# Patient Record
Sex: Female | Born: 1965
Health system: Southern US, Community
[De-identification: ages and names within clinical notes are randomized; demographics above are authoritative.]

## PROBLEM LIST (undated history)

## (undated) DIAGNOSIS — M810 Age-related osteoporosis without current pathological fracture: Secondary | ICD-10-CM

## (undated) DIAGNOSIS — N809 Endometriosis, unspecified: Secondary | ICD-10-CM

## (undated) DIAGNOSIS — N80129 Deep endometriosis of ovary, unspecified ovary: Secondary | ICD-10-CM

## (undated) DIAGNOSIS — I4891 Unspecified atrial fibrillation: Secondary | ICD-10-CM

## (undated) HISTORY — DX: Deep endometriosis of ovary, unspecified ovary: N80.129

## (undated) HISTORY — DX: Age-related osteoporosis without current pathological fracture: M81.0

## (undated) HISTORY — DX: Unspecified atrial fibrillation: I48.91

## (undated) HISTORY — DX: Endometriosis, unspecified: N80.9

---

## 2001-01-13 ENCOUNTER — Other Ambulatory Visit: Admission: RE | Admit: 2001-01-13 | Discharge: 2001-01-13 | Payer: Self-pay | Admitting: Family Medicine

## 2004-08-06 ENCOUNTER — Ambulatory Visit: Payer: Self-pay | Admitting: Family Medicine

## 2004-08-19 ENCOUNTER — Ambulatory Visit: Payer: Self-pay | Admitting: Family Medicine

## 2005-02-08 ENCOUNTER — Emergency Department: Payer: Self-pay | Admitting: Emergency Medicine

## 2005-02-09 ENCOUNTER — Ambulatory Visit: Payer: Self-pay | Admitting: Emergency Medicine

## 2005-03-16 HISTORY — PX: PELVIC LAPAROSCOPY: SHX162

## 2005-03-16 HISTORY — PX: OOPHORECTOMY: SHX86

## 2005-05-26 ENCOUNTER — Other Ambulatory Visit: Admission: RE | Admit: 2005-05-26 | Discharge: 2005-05-26 | Payer: Self-pay | Admitting: Obstetrics and Gynecology

## 2005-08-11 ENCOUNTER — Ambulatory Visit (HOSPITAL_BASED_OUTPATIENT_CLINIC_OR_DEPARTMENT_OTHER): Admission: RE | Admit: 2005-08-11 | Discharge: 2005-08-11 | Payer: Self-pay | Admitting: Obstetrics and Gynecology

## 2005-08-11 ENCOUNTER — Encounter (INDEPENDENT_AMBULATORY_CARE_PROVIDER_SITE_OTHER): Payer: Self-pay | Admitting: *Deleted

## 2005-08-25 ENCOUNTER — Ambulatory Visit: Payer: Self-pay | Admitting: Nurse Practitioner

## 2005-08-31 ENCOUNTER — Ambulatory Visit: Payer: Self-pay | Admitting: Nurse Practitioner

## 2006-01-29 ENCOUNTER — Ambulatory Visit: Payer: Self-pay

## 2006-05-07 ENCOUNTER — Ambulatory Visit: Payer: Self-pay | Admitting: Internal Medicine

## 2006-06-17 ENCOUNTER — Other Ambulatory Visit: Admission: RE | Admit: 2006-06-17 | Discharge: 2006-06-17 | Payer: Self-pay | Admitting: Obstetrics and Gynecology

## 2006-10-21 ENCOUNTER — Encounter: Admission: RE | Admit: 2006-10-21 | Discharge: 2006-10-21 | Payer: Self-pay | Admitting: Obstetrics and Gynecology

## 2007-06-29 ENCOUNTER — Other Ambulatory Visit: Admission: RE | Admit: 2007-06-29 | Discharge: 2007-06-29 | Payer: Self-pay | Admitting: Obstetrics and Gynecology

## 2007-10-31 ENCOUNTER — Encounter: Admission: RE | Admit: 2007-10-31 | Discharge: 2007-10-31 | Payer: Self-pay | Admitting: Obstetrics and Gynecology

## 2008-05-02 ENCOUNTER — Ambulatory Visit: Payer: Self-pay | Admitting: Specialist

## 2008-05-08 ENCOUNTER — Encounter: Admission: RE | Admit: 2008-05-08 | Discharge: 2008-05-08 | Payer: Self-pay | Admitting: Internal Medicine

## 2008-07-11 ENCOUNTER — Other Ambulatory Visit: Admission: RE | Admit: 2008-07-11 | Discharge: 2008-07-11 | Payer: Self-pay | Admitting: Obstetrics and Gynecology

## 2008-07-11 ENCOUNTER — Ambulatory Visit: Payer: Self-pay | Admitting: Obstetrics and Gynecology

## 2008-07-11 ENCOUNTER — Encounter: Payer: Self-pay | Admitting: Obstetrics and Gynecology

## 2008-10-31 ENCOUNTER — Encounter: Admission: RE | Admit: 2008-10-31 | Discharge: 2008-10-31 | Payer: Self-pay | Admitting: Obstetrics and Gynecology

## 2008-11-28 ENCOUNTER — Ambulatory Visit: Payer: Self-pay | Admitting: Obstetrics and Gynecology

## 2009-07-25 ENCOUNTER — Other Ambulatory Visit: Admission: RE | Admit: 2009-07-25 | Discharge: 2009-07-25 | Payer: Self-pay | Admitting: Obstetrics and Gynecology

## 2009-07-25 ENCOUNTER — Ambulatory Visit: Payer: Self-pay | Admitting: Obstetrics and Gynecology

## 2009-11-04 ENCOUNTER — Encounter: Admission: RE | Admit: 2009-11-04 | Discharge: 2009-11-04 | Payer: Self-pay | Admitting: Obstetrics and Gynecology

## 2009-11-06 ENCOUNTER — Encounter: Admission: RE | Admit: 2009-11-06 | Discharge: 2009-11-06 | Payer: Self-pay | Admitting: Obstetrics and Gynecology

## 2010-05-06 IMAGING — CT CT CHEST W/ CM
1 series · 15 of 32 positions shown, 19 images · IV contrast (agent unspecified)
Comparison: none

REASON FOR EXAM: abnormal chest xray showed density
COMMENTS:

PROCEDURE:     CT  - CT CHEST WITH CONTRAST  - May 02, 2008  [DATE]
RESULT:
HISTORY: Abnormal chest x-ray.
COMPARISON STUDIES:  No recent.

[Series 2: soft tissue · axial · 0.64mm/px · z∈[-31,+259]mm · 15 of 66 slices shown, 19 images]
[im 5/66  soft-tissue]
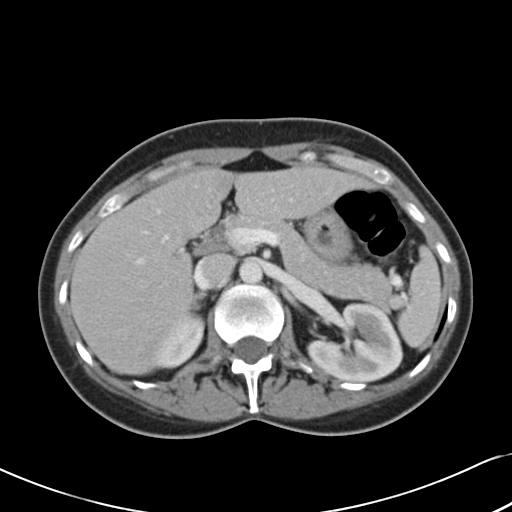
[im 5/66  bone]
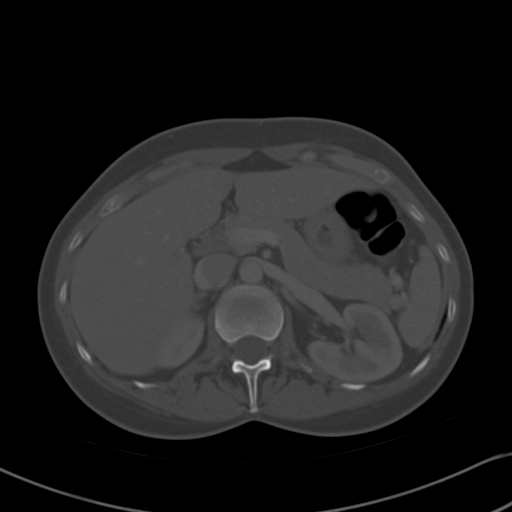
[im 9/66  soft-tissue]
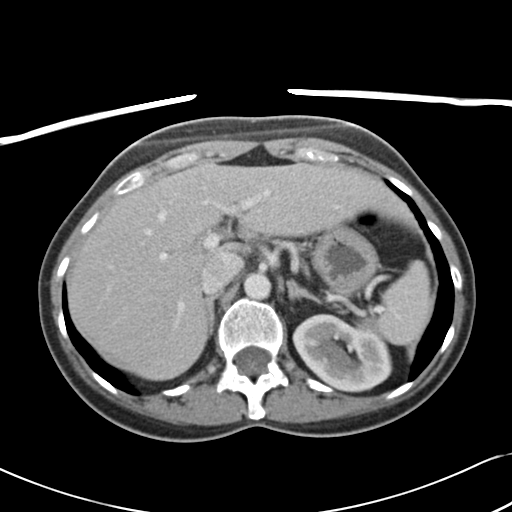
[im 13/66  soft-tissue]
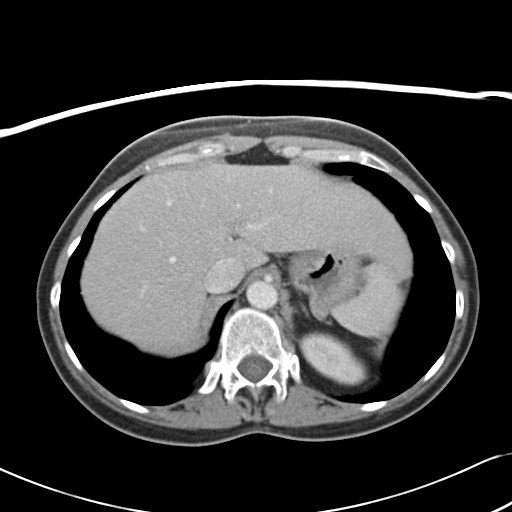
[im 19/66  soft-tissue]
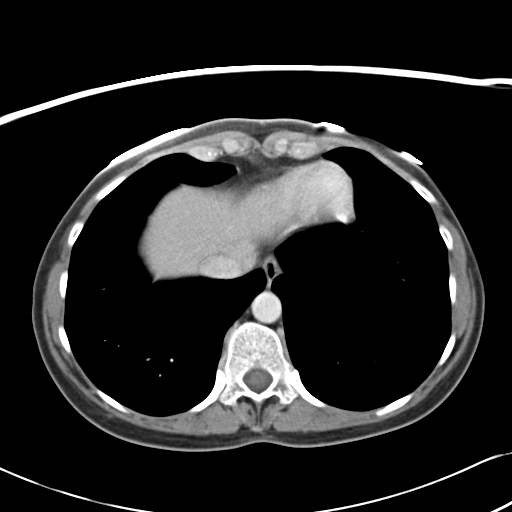
[im 24/66  soft-tissue]
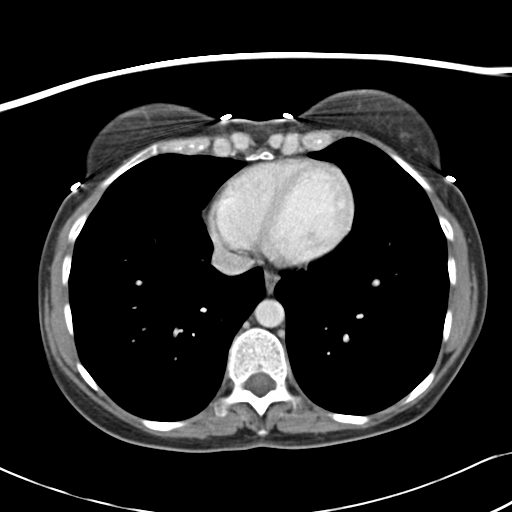
[im 28/66  soft-tissue]
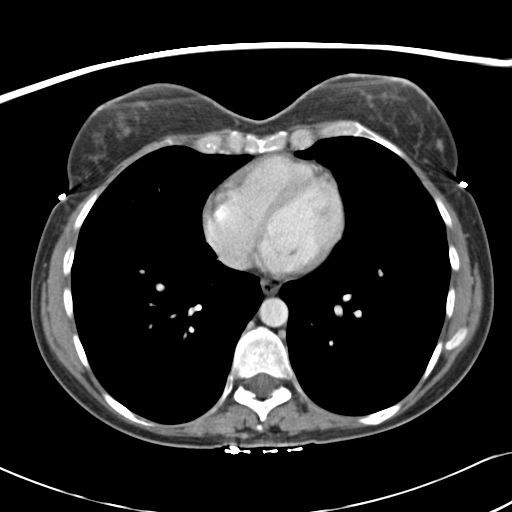
[im 34/66  soft-tissue]
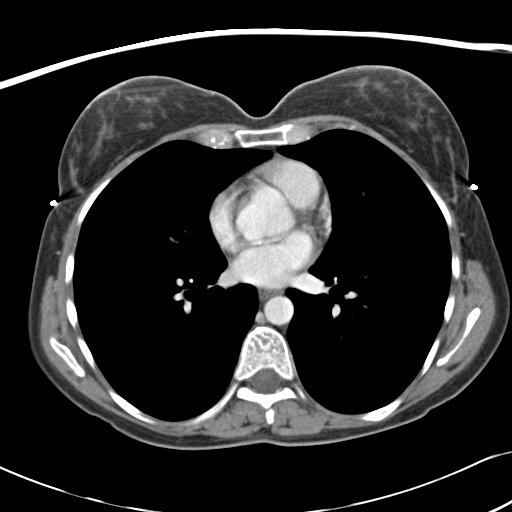
[im 38/66  soft-tissue]
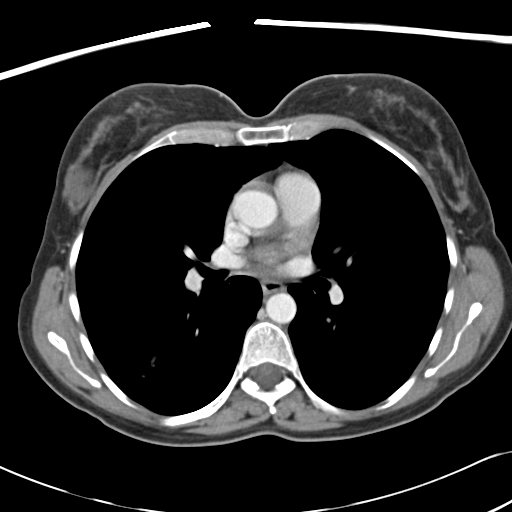
[im 42/66  soft-tissue]
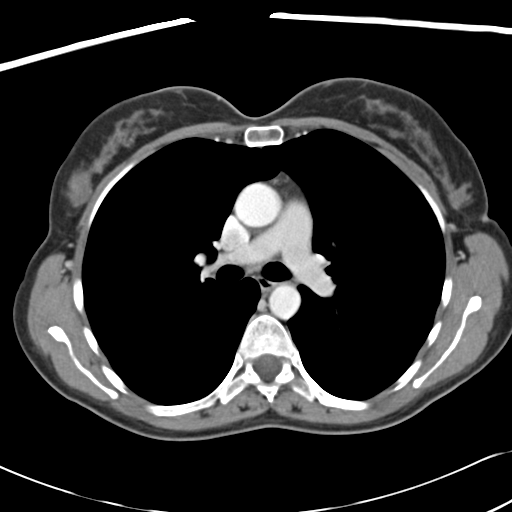
[im 42/66  bone]
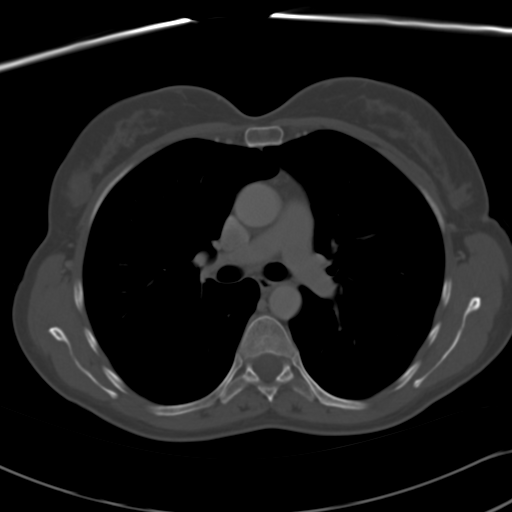
[im 47/66  soft-tissue]
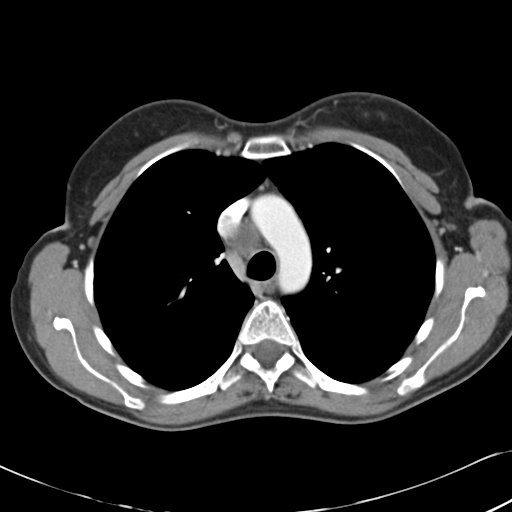
[im 53/66  soft-tissue]
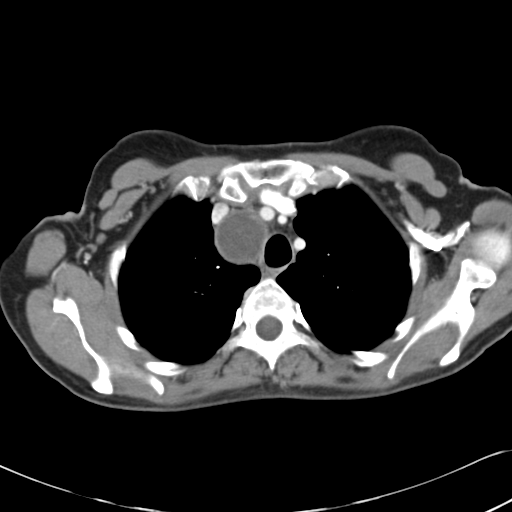
[im 57/66  soft-tissue]
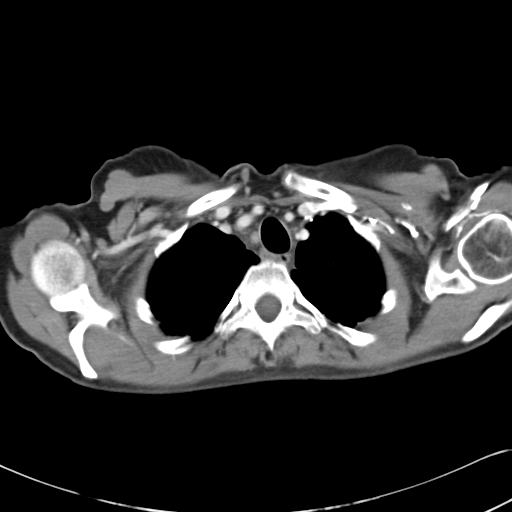
[im 57/66  lung]
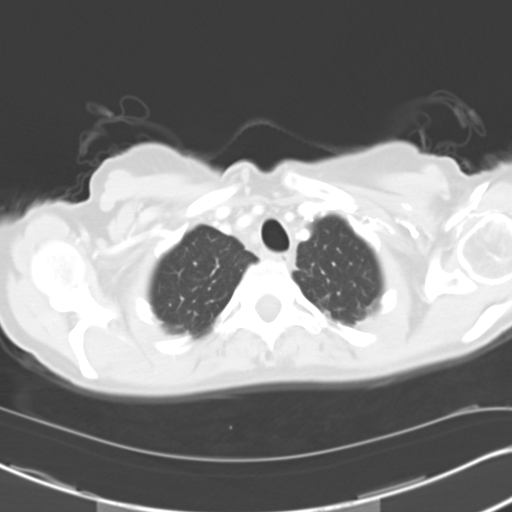
[im 59/66  lung]
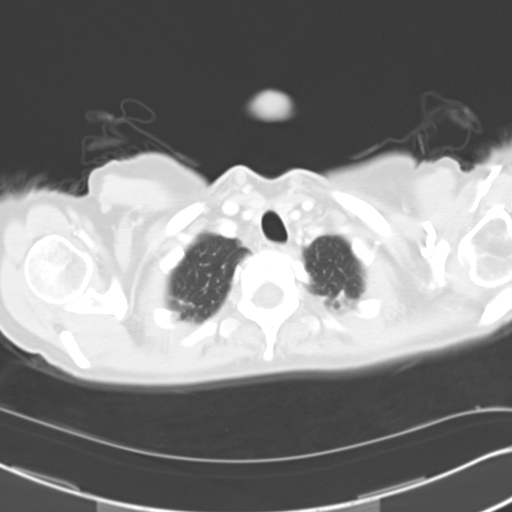
[im 61/66  soft-tissue]
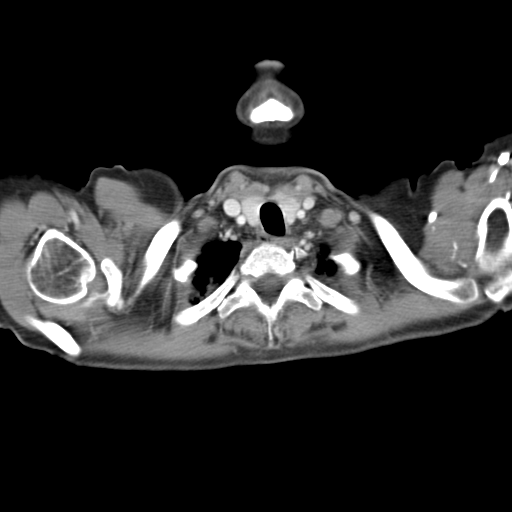
[im 61/66  lung]
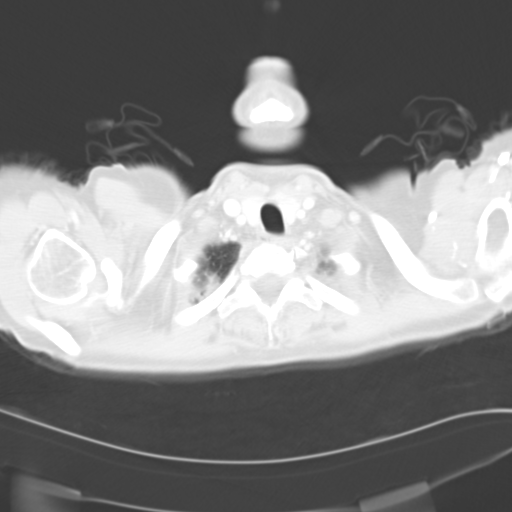
[im 63/66  lung]
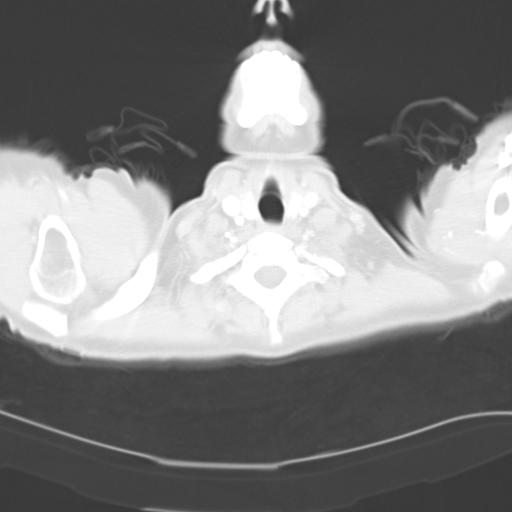

[15 of 32 positions shown; findings below may reference images not displayed]

FINDINGS: A 3.6 cm right middle mediastinal cyst is noted. Hounsfield units
of 10 are noted. This is most likely a bronchial duplication cyst. This can
be followed with subsequent CT's. If need be, we can perform PET/CT.
Cardiothoracic surgery consultation may prove useful. Mild prominence of the
right breast tissue is noted laterally. Mammogram is suggested for further
evaluation. The adrenals are normal. The large airways are patent. Patchy
nodular infiltrates are noted in the right lower lobe. These are most likely
infectious in etiology and although these could represent standard bacterial
pneumonia, granulomatous infection or opportunistic infection cannot be
excluded.
IMPRESSION: 1.  Right paratracheal/mediastinal cyst, most likely a duplication cyst.
2.  Right breast prominent parenchymal density. Mammography is suggested for
further evaluation.
3.  Patchy nodular infiltrates in the right lower lobe as described above.

The findings were discussed with the patient's physician at the time of the
study.

## 2010-08-01 NOTE — Op Note (Signed)
[name], [name]                  ACCOUNT NO.:  [id number]   MEDICAL RECORD NO.:  [id number]          PATIENT TYPE:  AMB   LOCATION:  [facility]                         FACILITY:  [facility]   PHYSICIAN:  Daniel L. Gottsegen, M.D.DATE OF BIRTH:  1965-06-04   DATE OF PROCEDURE:  08/11/2005  DATE OF DISCHARGE:                                 OPERATIVE REPORT   PREOPERATIVE DIAGNOSIS:  Right adnexal mass.   POSTOPERATIVE DIAGNOSES:  Endometrioma of right ovary, endometriosis.   NAME OF OPERATION:  Diagnostic laparoscopy with right salpingo-oophorectomy,  laser vaporization of endometriosis.   SURGEON:  Dr. Eda Paschal.   FIRST ASSISTANT:  Dr. Lily Peer.   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  An  ovarian neoplasm was diagnosed.  It has been watched and it has failed to  resolve, and so she now enters the hospital for definitive surgery.  It is  approximately 6 cm.  It has an appearance most consistent with  endometriosis.   FINDINGS:  At the time of surgery, the patient had a large endometrioma of  the right ovary of about 6 cm.  It was removed intact, but after it was  removed it was opened outside the body and was clearly an endometrioma.  The  patient also had endometriosis involving the cul-de-sac, the vesicouterine  fold of peritoneum, the left ovary and underneath the left ovary.  Some of  the endometriosis was pigmented, some of it was red, some of it was  nonpigmented.  Total area involved was approximately 4 cm.  It was more  superficial than deep other than the endometrioma on the right.  The  ileocecal junction was identified, and the patient had a normal appendix.   PROCEDURE:  After adequate general endotracheal anesthesia, the patient was  placed in the dorsal lithotomy position, prepped and draped in the usual  sterile manner.  A Hulka tenaculum had been inserted in  the patient's  uterus.  A Robinson catheter was utilized to empty the bladder.  A  pneumoperitoneum was created with the Veress needle, and then using the  Optiview, a 10-mm laparoscope was introduced subumbilically through the  subumbilical incision that had been made with the Veress needle.  Two other  ports were placed in the pelvis, one in the midline and one in the right  lower quadrant, and 5-mm instruments were placed through it including  irrigator-aspirators, graspers, bipolar, tripolar.  First, peritoneal  washings were obtained.  Next, attention was turned to the right ovary and  tube.  It was elevated.  The ureter was identified.  The IP ligament was  bipolared and cut.  The rest of the attachments of the ovary and tube to the  broad ligaments of the uterus were bipolared and cut.  No bleeding was  encountered.  Following this, the neodynium yag________ laser was utilized  through the operating scope of the subumbilical channel.  It was used at 12  watts power.  A GR-4 tip was  used over the bare fiber.  All areas of  endometriosis could be laser vaporized without injuring any vital  structures.  Copious irrigation was done with sterile saline.  At the  termination of procedure, there was no bleeding noted.  At this point, the  laparoscope was removed.  A 5-mm laparoscope was placed in the right lower  quadrant.  An Endopouch was placed subumbilical and the specimen was removed  intact.  The procedure was then terminated.  The pneumoperitoneum evacuated.  The  subumbilical fascial incision was closed with figure-of-eights of 0 Vicryl.  The subumbilical skin incision was closed with 3-0 Monocryl, and Steri-  Strips were used on the pelvic 5-mm incisions.  The patient tolerated the  procedure well and left the operating room in satisfactory condition.      Daniel L. Eda Paschal, M.D.  Electronically Signed     DLG/MEDQ  D:  08/11/2005  T:  08/11/2005  Job:  [id number]

## 2010-08-12 ENCOUNTER — Encounter: Payer: Self-pay | Admitting: Obstetrics and Gynecology

## 2010-08-28 ENCOUNTER — Other Ambulatory Visit (HOSPITAL_COMMUNITY)
Admission: RE | Admit: 2010-08-28 | Discharge: 2010-08-28 | Disposition: A | Discharge status: Home / Self Care | Payer: BC Managed Care – PPO | Source: Ambulatory Visit | Attending: Obstetrics and Gynecology | Admitting: Obstetrics and Gynecology

## 2010-08-28 ENCOUNTER — Other Ambulatory Visit: Payer: Self-pay | Admitting: Obstetrics and Gynecology

## 2010-08-28 ENCOUNTER — Encounter (INDEPENDENT_AMBULATORY_CARE_PROVIDER_SITE_OTHER): Payer: BC Managed Care – PPO | Admitting: Obstetrics and Gynecology

## 2010-08-28 DIAGNOSIS — R809 Proteinuria, unspecified: Secondary | ICD-10-CM

## 2010-08-28 DIAGNOSIS — Z124 Encounter for screening for malignant neoplasm of cervix: Secondary | ICD-10-CM | POA: Insufficient documentation

## 2010-08-28 DIAGNOSIS — Z01419 Encounter for gynecological examination (general) (routine) without abnormal findings: Secondary | ICD-10-CM

## 2010-10-31 ENCOUNTER — Other Ambulatory Visit: Payer: Self-pay | Admitting: Obstetrics and Gynecology

## 2010-10-31 DIAGNOSIS — Z1231 Encounter for screening mammogram for malignant neoplasm of breast: Secondary | ICD-10-CM

## 2010-11-07 ENCOUNTER — Telehealth: Payer: Self-pay | Admitting: *Deleted

## 2010-11-07 DIAGNOSIS — N912 Amenorrhea, unspecified: Secondary | ICD-10-CM

## 2010-11-07 NOTE — Telephone Encounter (Signed)
Patient informed needs FSH. Will get done at her [facility]. Faxed order.

## 2010-11-07 NOTE — Telephone Encounter (Signed)
Patient called c/o last menses was 09/13/10.  She has had no sign of it coming on.  No cramps, spotting, change of diet, or stress going on.  Wants to know if she needs to have her labs checked.

## 2010-11-07 NOTE — Telephone Encounter (Signed)
Have patient come in for an [facility]. Have pt call me for the results. She works at the [employer]. If she would like to have the [facility] drawn they are then call In order please.

## 2010-11-10 ENCOUNTER — Ambulatory Visit
Admission: RE | Admit: 2010-11-10 | Discharge: 2010-11-10 | Disposition: A | Discharge status: Home / Self Care | Payer: BC Managed Care – PPO | Source: Ambulatory Visit | Attending: Obstetrics and Gynecology | Admitting: Obstetrics and Gynecology

## 2010-11-10 ENCOUNTER — Encounter: Payer: Self-pay | Admitting: Obstetrics and Gynecology

## 2010-11-10 DIAGNOSIS — Z1231 Encounter for screening mammogram for malignant neoplasm of breast: Secondary | ICD-10-CM

## 2010-11-11 ENCOUNTER — Telehealth: Payer: Self-pay

## 2010-11-11 MED ORDER — MEDROXYPROGESTERONE ACETATE 10 MG PO TABS: 10.0000 mg | ORAL_TABLET | Freq: Every day | ORAL | Status: DC

## 2010-11-11 NOTE — Telephone Encounter (Signed)
PT. CALLED BACK AND WANTS TO KNOW IF SHE CAN WAIT TO START PROVERA RX UNTIL AFTER SHE RETURNS FROM VACATION. SHE THINKS SHE WILL BE LEAVING 11/13/10 OR 11/18/10.

## 2010-11-11 NOTE — Telephone Encounter (Signed)
Message copied by Venora Maples on Tue Nov 11, 2010 12:52 PM ------      Message from: Trellis Paganini      Created: Mon Nov 10, 2010 12:50 PM       Tell patient her [facility] is in the premenopausal range. We should give her Provera 10 mg a day for 5 days. I think she may already  have a prescription but please check with her.

## 2010-11-11 NOTE — Telephone Encounter (Signed)
Yes

## 2010-11-11 NOTE — Telephone Encounter (Signed)
PT. NOTIFIED PER DR. G'S NOTE.

## 2010-11-11 NOTE — Telephone Encounter (Signed)
PT. NOTIFIED OF DR. G'S NOTE BELOW. SHE DOES NOT HAVE RX SO I SENT IT TO HER [facility].

## 2010-12-10 ENCOUNTER — Telehealth: Payer: Self-pay | Admitting: *Deleted

## 2010-12-10 NOTE — Telephone Encounter (Signed)
Patient just wanted to let you know that she started her period on her own on 11/28/10.  Bled ill 12/01/10 then started spotting 12/01/10-12/05/10.  Patient also has been on an antibiotic at that time.  She also wanted to let you know that she had a few hot flashes at night during the period time.  This was just an [place].

## 2010-12-10 NOTE — Telephone Encounter (Signed)
Thank you :)

## 2011-02-04 NOTE — Progress Notes (Signed)
Patient ID: [name], female   DOB: 07-03-1965, 45 y.o.   MRN: [id number] PER DR. Reece Agar AFTER REVIEWING THE FAXED NOTE AND RECORDS PT. SENT TO HIM, HE RECOMMENDS RECHECK U.S. AND 30 MINUTE OV IN 8 WEEKS. PT. NOTIFIED OF ALL THE ABOVE INFO. AND IS FINE TO WAIT UNTIL THEN. I TRANSFERRED HER UP TO APPOINTMENTS.

## 2011-03-18 DIAGNOSIS — N80129 Deep endometriosis of ovary, unspecified ovary: Secondary | ICD-10-CM | POA: Insufficient documentation

## 2011-03-18 DIAGNOSIS — N809 Endometriosis, unspecified: Secondary | ICD-10-CM | POA: Insufficient documentation

## 2011-03-23 ENCOUNTER — Other Ambulatory Visit: Payer: Self-pay | Admitting: Obstetrics and Gynecology

## 2011-03-24 ENCOUNTER — Ambulatory Visit: Payer: BC Managed Care – PPO

## 2011-03-24 ENCOUNTER — Ambulatory Visit (INDEPENDENT_AMBULATORY_CARE_PROVIDER_SITE_OTHER): Payer: BC Managed Care – PPO

## 2011-03-24 ENCOUNTER — Ambulatory Visit: Payer: BC Managed Care – PPO | Admitting: Obstetrics and Gynecology

## 2011-03-24 ENCOUNTER — Ambulatory Visit (INDEPENDENT_AMBULATORY_CARE_PROVIDER_SITE_OTHER): Payer: BC Managed Care – PPO | Admitting: Obstetrics and Gynecology

## 2011-03-24 DIAGNOSIS — N949 Unspecified condition associated with female genital organs and menstrual cycle: Secondary | ICD-10-CM

## 2011-03-24 DIAGNOSIS — N809 Endometriosis, unspecified: Secondary | ICD-10-CM

## 2011-03-24 DIAGNOSIS — N83209 Unspecified ovarian cyst, unspecified side: Secondary | ICD-10-CM

## 2011-03-24 DIAGNOSIS — R102 Pelvic and perineal pain: Secondary | ICD-10-CM

## 2011-03-24 NOTE — Progress Notes (Signed)
Patient went to see her PCP a in November of 2012. She was complaining of right lower quadrant pain which she now thinks was muscular. We have previously done a right salpingo-oophorectomy for endometriosis. He sent her for a pelvic ultrasound which showed 2 cysts on her left ovary. One cyst was a simple follicle of 15 mm. The second cyst was complex of 19 mm. It had a septum and some internal echoes. There was free fluid seen lateral to the ovary. The patient is now asymptomatic but she came back for followup ultrasound.  Ultrasound: Patient has an anteverted uterus with a homogeneous echo pattern. She does have a arcuate uterus with an endometrial echo 7.1 mm. Her right adnexa is free of the mass. On the left adnexa there is a thick walled 1.1 cm echo-free cyst without vascular flow. There is a second cyst which is thick walled 2.2 cm with internal low level echoes and vascular flow to the periphery. There is a third cyst which is 1.9 cm and is thin walled with reticular echo pattern. This third cyst is vascular free.  Assessment: Asymptomatic small cysts of left ovary.  Plan: I told patient I think these are nonsuspicious. Certainly one of them could be endometriosis. Since she is asymptomatic I believe we should do nothing surgically at the moment. She will let me know if she starts to get pain. Plan followup ultrasound on the same day as her yearly exam in 4 months.

## 2011-07-27 ENCOUNTER — Telehealth: Payer: Self-pay | Admitting: *Deleted

## 2011-07-27 NOTE — Telephone Encounter (Signed)
Pt saw you for ultrasound in jan 2013 and was told to call back when cycle started, which was today. She was told to check with insurance to see if ultrasound and annual can be done same day and it can. She would like to know if you wanted ultrasound & annual in June (this is when her annual is due) 09/02/11 OV. Or have ultrasound done in may, per pt was told to call back in may and let you know when cycle started. Please advise

## 2011-07-27 NOTE — Telephone Encounter (Signed)
Pt informed with the below note. 

## 2011-07-27 NOTE — Telephone Encounter (Signed)
We can do both ultrasound and exam the same day in June. Ideally it should be scheduled the week after  her cycle ends.

## 2011-08-26 ENCOUNTER — Other Ambulatory Visit: Payer: Self-pay | Admitting: Obstetrics and Gynecology

## 2011-08-26 DIAGNOSIS — N83209 Unspecified ovarian cyst, unspecified side: Secondary | ICD-10-CM

## 2011-09-02 ENCOUNTER — Encounter: Payer: Self-pay | Admitting: Obstetrics and Gynecology

## 2011-09-02 ENCOUNTER — Other Ambulatory Visit (HOSPITAL_COMMUNITY)
Admission: RE | Admit: 2011-09-02 | Discharge: 2011-09-02 | Disposition: A | Discharge status: Home / Self Care | Payer: BC Managed Care – PPO | Source: Ambulatory Visit | Attending: Obstetrics and Gynecology | Admitting: Obstetrics and Gynecology

## 2011-09-02 ENCOUNTER — Encounter: Payer: BC Managed Care – PPO | Admitting: Obstetrics and Gynecology

## 2011-09-02 ENCOUNTER — Ambulatory Visit (INDEPENDENT_AMBULATORY_CARE_PROVIDER_SITE_OTHER): Payer: BC Managed Care – PPO

## 2011-09-02 ENCOUNTER — Ambulatory Visit (INDEPENDENT_AMBULATORY_CARE_PROVIDER_SITE_OTHER): Payer: BC Managed Care – PPO | Admitting: Obstetrics and Gynecology

## 2011-09-02 VITALS — BP 110/70 | Ht 65.5 in | Wt 133.0 lb

## 2011-09-02 DIAGNOSIS — Z01419 Encounter for gynecological examination (general) (routine) without abnormal findings: Secondary | ICD-10-CM | POA: Insufficient documentation

## 2011-09-02 DIAGNOSIS — N8300 Follicular cyst of ovary, unspecified side: Secondary | ICD-10-CM

## 2011-09-02 DIAGNOSIS — N949 Unspecified condition associated with female genital organs and menstrual cycle: Secondary | ICD-10-CM

## 2011-09-02 DIAGNOSIS — R102 Pelvic and perineal pain: Secondary | ICD-10-CM

## 2011-09-02 DIAGNOSIS — N83209 Unspecified ovarian cyst, unspecified side: Secondary | ICD-10-CM

## 2011-09-02 NOTE — Progress Notes (Signed)
Patient came to see me today for her annual GYN exam. She is having regular cycles although slightly irregular. They are tolerable. She contracepts  with condoms. She is due for her mammogram in August. She needs followup for her small ovarian cysts seen previously and will have an ultrasound after today's visit. She does her lab work from her PCP. Her pelvic pain is basically gone. She is having no abnormal bleeding.  Physical examination:[name] present. HEENT within normal limits. Neck: Thyroid not large. No masses. Supraclavicular nodes: not enlarged. Breasts: Examined in both sitting and lying  position. No skin changes and no masses. Abdomen: Soft no guarding rebound or masses or hernia. Pelvic: External: Within normal limits. BUS: Within normal limits. Vaginal:within normal limits. Good estrogen effect. No evidence of cystocele rectocele or enterocele. Cervix: clean. Uterus: Normal size and shape. Adnexa: No masses. Rectovaginal exam: Confirmatory and negative. Extremities: Within normal limits.  Assessment: History of ovarian cysts. History of endometriosis.  Plan: Mammogram. Pelvic ultrasound. Discussed a Mirena IUD for birth control. She will inform if interested.  Addendum: Patient had her ultrasound. She has an arcuate uterus with 2 endometrial cavities. They are 8.2 and 7.6 mm. Right adnexa is negative for any masses. She is status post right S and O. Her left ovary appears normal. The 3 previous small ovarian cysts have resolved. She was reassured. She will take Provera when necessary for amenorrhea. Lab work ordered through the [facility].

## 2011-11-05 ENCOUNTER — Other Ambulatory Visit: Payer: Self-pay | Admitting: Obstetrics and Gynecology

## 2011-11-05 DIAGNOSIS — Z1231 Encounter for screening mammogram for malignant neoplasm of breast: Secondary | ICD-10-CM

## 2011-11-12 ENCOUNTER — Telehealth: Payer: Self-pay | Admitting: Obstetrics and Gynecology

## 2011-11-12 NOTE — Telephone Encounter (Signed)
Ideally she should do it on day 3 or 4 of her cycle.

## 2011-11-12 NOTE — Telephone Encounter (Signed)
At Dr Timoteo Expose request I contacted patient to follow-up because she never had FSH drawn. Dr. Reece Agar wanted me to find out if cycles have been regular.  Patient said she had not had a period since May until end of July when she did have a normal period.  She said she at June visit Dr. Reece Agar had given her the slip for [facility] to be drawn.  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   She will have it drawn but wanted me to ask Dr. Reece Agar as she thinks he told her she would get the truest reading if she had it drawn after her period??  Also, she wants Dr. Reece Agar to know she has her mammo scheduled for 11/30/11 at [facility].

## 2011-11-12 NOTE — Telephone Encounter (Signed)
Patient advised.

## 2011-11-30 ENCOUNTER — Ambulatory Visit
Admission: RE | Admit: 2011-11-30 | Discharge: 2011-11-30 | Disposition: A | Discharge status: Home / Self Care | Payer: BC Managed Care – PPO | Source: Ambulatory Visit | Attending: Obstetrics and Gynecology | Admitting: Obstetrics and Gynecology

## 2011-11-30 DIAGNOSIS — Z1231 Encounter for screening mammogram for malignant neoplasm of breast: Secondary | ICD-10-CM

## 2011-12-24 ENCOUNTER — Telehealth: Payer: Self-pay | Admitting: Obstetrics and Gynecology

## 2011-12-24 NOTE — Telephone Encounter (Signed)
Patient sent Dr. Reece Agar a letter.  Please see letter scanned in system dates 12-23-11.  I called patient back with Dr. Timoteo Expose response in regard to the letter. Dr. Reece Agar did not feel it would be beneficial to check another [facility] on 10/11 (day 3) as it was so low at 6.0 on 12/17/11.  Patient was advised if she still wanted to check it that tomorrow Day 3 would be the correct date.

## 2012-01-06 ENCOUNTER — Ambulatory Visit (INDEPENDENT_AMBULATORY_CARE_PROVIDER_SITE_OTHER): Payer: BC Managed Care – PPO | Admitting: Obstetrics and Gynecology

## 2012-01-06 ENCOUNTER — Encounter: Payer: Self-pay | Admitting: Obstetrics and Gynecology

## 2012-01-06 DIAGNOSIS — N912 Amenorrhea, unspecified: Secondary | ICD-10-CM

## 2012-01-06 MED ORDER — MEDROXYPROGESTERONE ACETATE 10 MG PO TABS: 10.0000 mg | ORAL_TABLET | Freq: Every day | ORAL | Status: DC

## 2012-01-06 NOTE — Patient Instructions (Signed)
Every 60 days that you do  not have a period Take Provera for 5 days. Call us if you do not start within 2 weeks of finishing the Provera. If  You do not use  birth control Do a pregnancy test before you start Provera.

## 2012-01-06 NOTE — Progress Notes (Signed)
Patient came back to see me today to discuss her cycles. For awhile she had amenorrhea and she had an elevated FSH. Then she started to have regular cycles again and her [facility] returned to  Normal(premenopausal). She continues to use a condom for birth control. She is now had a cycle in August and October. She is not having menopausal symptoms.  We discussed this in great detail. She will continue to use condoms. If she goes over 60 days without bleeding she will take Provera for 5 days to prevent hyperplasia. She will call if she does not withdrawal. She will do a pregnancy test first if she does  does not use birth control.

## 2012-09-06 ENCOUNTER — Encounter: Payer: Self-pay | Admitting: Gynecology

## 2012-09-06 ENCOUNTER — Ambulatory Visit (INDEPENDENT_AMBULATORY_CARE_PROVIDER_SITE_OTHER): Payer: BC Managed Care – PPO | Admitting: Gynecology

## 2012-09-06 VITALS — BP 110/70 | Ht 66.0 in | Wt 134.0 lb

## 2012-09-06 DIAGNOSIS — Z01419 Encounter for gynecological examination (general) (routine) without abnormal findings: Secondary | ICD-10-CM

## 2012-09-06 DIAGNOSIS — N926 Irregular menstruation, unspecified: Secondary | ICD-10-CM

## 2012-09-06 NOTE — Progress Notes (Signed)
[name] Dec 30, 1965 [id number]        47 y.o.  G0P0 for annual exam.  Former patient of Dr. Eda Goodman. Several issues noted below.  Past medical history,surgical history, medications, allergies, family history and social history were all reviewed and documented in the EPIC chart.  ROS:  Performed and pertinent positives and negatives are included in the history, assessment and plan .  Exam: [name] assistant Filed Vitals:   09/06/12 1456  BP: 110/70  Height: 5\' 6"  (1.676 m)  Weight: 134 lb (60.782 kg)   General appearance  Normal Skin grossly normal Head/Neck normal with no cervical or supraclavicular adenopathy thyroid normal Lungs  clear Cardiac RR, without RMG Abdominal  soft, nontender, without masses, organomegaly or hernia Breasts  examined lying and sitting without masses, retractions, discharge or axillary adenopathy. Pelvic  Ext/BUS/vagina  normal   Cervix  normal   Uterus  anteverted, normal size, shape and contour, midline and mobile nontender   Adnexa  Without masses or tenderness    Anus and perineum  normal   Rectovaginal  normal sphincter tone without palpated masses or tenderness.    Assessment/Plan:  47 y.o. G0P0 female for annual exam.   1. Irregular menses. Patient apparently had gone through premature menopause with elevated FSH in the past but then resumed menstrual function. She now has some mild irregularity where she may skip one month. Dr. Eda Goodman had given her Provera to withdraw if she does more than 2 months without a menses. She has not used it this past year. No intermenstrual bleeding. We'll recheck her TSH and FSH now. She has the blood drawn at her office I gave her prescription and it will be called back to [place]. Options for management to include low-dose oral contraceptives for both menstrual regulation, contraception and occasional hot flash control. Alternatives such as Mirena IUD also discussed. Patient is not interested in doing anything at this  point but monitoring and keeping a menstrual calendar. She will call me if she goes more than 2 months without menses for the Provera. 2. Contraception. Patient using condoms. As per above we discussed alternatives but she is satisfied with condoms and understands accepts and failure risks. 3. Bone density. Patient had bone density done at her office in 2011 when she was diagnosed with premature menopause. T score -2.2 Z score -1.7. I discussed the issues of low bone mass in the difference between T-scores and Z scores. The issue as to whether she was truly postmenopausal and which value to use. As she is now regularly menstruating will plan on monitoring and repeating her bone density at age 81 as a new baseline. Increase calcium vitamin D reviewed. 4. History of endometriosis with endometrioma status post RSO in the past. Doing well without significant pain or other symptoms. 5. Pap smear 2013. No Pap smear done today. No history of significant abnormal Pap smears previously. Repeat Pap smear at 3 year interval. 6. Mammography 11/2011. Continued annual mammography. 7. Health maintenance. CBC comprehensive metabolic panel lipid profile urinalysis prescription given to have drawn at her work and to mail back. Followup one year, sooner as needed.    Kimberly Lords MD, 4:07 PM 09/06/2012

## 2012-09-06 NOTE — Patient Instructions (Signed)
Call if you go more than 2 months without a period. Forward your blood work drawn at work to our office.

## 2012-09-13 ENCOUNTER — Encounter: Payer: Self-pay | Admitting: Gynecology

## 2012-09-13 ENCOUNTER — Telehealth: Payer: Self-pay | Admitting: Gynecology

## 2012-09-13 NOTE — Telephone Encounter (Signed)
Tell patient FSH is elevated consistent with menopause. I would recommend keeping menstrual calendar and as long as less frequent but "regular" menses and watch. If prolonged or atypical bleeding call.

## 2012-09-13 NOTE — Telephone Encounter (Signed)
PT INFORMED WITH THE BELOW NOTE. 

## 2012-10-04 ENCOUNTER — Telehealth: Payer: Self-pay | Admitting: *Deleted

## 2012-10-04 MED ORDER — MEDROXYPROGESTERONE ACETATE 10 MG PO TABS: 10.0000 mg | ORAL_TABLET | Freq: Every day | ORAL | Status: DC

## 2012-10-04 NOTE — Telephone Encounter (Signed)
Pt called stating she has not had a cycle since April this year, some very light spotting but never a full flow. Pt said if this should occur she was told to call back and Rx for provera will be sent to pharmacy. Please advise

## 2012-10-04 NOTE — Telephone Encounter (Signed)
Make sure patient checks UPT OTC first, then Provera 10 mg daily x12 days

## 2012-10-04 NOTE — Telephone Encounter (Signed)
Left a detailed message on her voicemail per request with the below note, Rx called in to pharmacy. I told pt if test should be positive not to take medication and call office.

## 2012-10-31 ENCOUNTER — Telehealth: Payer: Self-pay | Admitting: *Deleted

## 2012-10-31 ENCOUNTER — Encounter: Payer: Self-pay | Admitting: *Deleted

## 2012-10-31 NOTE — Telephone Encounter (Signed)
Pt informed with the below note, she will watch for now and call back if needed. Pt asked if a not could be given stating she was in office for annual in June.

## 2012-10-31 NOTE — Telephone Encounter (Signed)
Pt said she had this done already, I looked in computer and the most recent lab on 09/09/12 with TSH/FSH. Do you need another level to be drawn?

## 2012-10-31 NOTE — Telephone Encounter (Signed)
Pt calling to follow up from telephone encounter 10/04/12, she took the last pill of provera 10 mg x 12 days on 10/22/12. And no cycle yet, pt asked should she wait a little longer? Please advise

## 2012-10-31 NOTE — Telephone Encounter (Signed)
I had ordered a TSH/FSH that she was to have drawn at her office and send [place] the results. I do not see those in our computer. If we can get those results first before I decide what to do.

## 2012-10-31 NOTE — Telephone Encounter (Signed)
I did not see that before. Her FSH is elevated consistent with menopausal changes and it is not unusual not to withdraw to Provera. At this point I would just watch. As long as she has less frequent but regular menses when they occur then I would follow. She did have prolonged or atypical bleeding than to have her call. It does not appear that she needs to take the Provera on any regular basis because she probably is not growing enough lining of the uterus to shed to bring on a period.

## 2012-11-04 ENCOUNTER — Other Ambulatory Visit: Payer: Self-pay

## 2012-11-04 DIAGNOSIS — Z1231 Encounter for screening mammogram for malignant neoplasm of breast: Secondary | ICD-10-CM

## 2012-11-30 ENCOUNTER — Ambulatory Visit
Admission: RE | Admit: 2012-11-30 | Discharge: 2012-11-30 | Disposition: A | Discharge status: Home / Self Care | Payer: BC Managed Care – PPO | Source: Ambulatory Visit

## 2012-11-30 DIAGNOSIS — Z1231 Encounter for screening mammogram for malignant neoplasm of breast: Secondary | ICD-10-CM

## 2013-03-07 ENCOUNTER — Telehealth: Payer: Self-pay

## 2013-03-07 ENCOUNTER — Other Ambulatory Visit: Payer: Self-pay | Admitting: Gynecology

## 2013-03-07 MED ORDER — MEDROXYPROGESTERONE ACETATE 10 MG PO TABS: 10.0000 mg | ORAL_TABLET | Freq: Every day | ORAL | Status: DC

## 2013-03-07 NOTE — Telephone Encounter (Signed)
I would recommend a repeat course of Provera 10 mg x10 days.

## 2013-03-07 NOTE — Telephone Encounter (Signed)
Patient informed Rx in. 

## 2013-03-07 NOTE — Telephone Encounter (Signed)
Per patient FSH level in June 2014 was 122.4. She said in Oct 2014 she had it retested out of curiosity because her hotflashes had stopped and her FSH level was 15.0.  She said she still has had no menses since July.  Had a little brown discharge in Oct and now with a little brown discharge. She wonders is period trying to start.  She just wanted to be sure that she is going along okay and not anything different she needs to be doing. She said back in July you had given her a round of Provera.

## 2013-03-13 ENCOUNTER — Other Ambulatory Visit: Payer: Self-pay

## 2013-03-21 ENCOUNTER — Telehealth: Payer: Self-pay | Admitting: *Deleted

## 2013-03-21 NOTE — Telephone Encounter (Signed)
As long as patient as long as pregnancy not a possibility I would take the Provera. If possible I would check a UPT first and then take the Provera.

## 2013-03-21 NOTE — Telephone Encounter (Signed)
Pt was given Rx for provera 10 mg x 10 days on 03/07/13 she never started Rx yet. Patient said this am she had some bright red blood then throughout the day brown blood, feels as if cycle may start but no flow. No cycle since July 2014, pt asked if you thought she should still take provera or wait to see if cycle comes? Please advise

## 2013-03-22 NOTE — Telephone Encounter (Signed)
Pt informed with the below note. 

## 2013-03-22 NOTE — Telephone Encounter (Signed)
Pt said no chance of pregnancy she will start provera.

## 2013-06-23 ENCOUNTER — Telehealth: Payer: Self-pay | Admitting: *Deleted

## 2013-06-23 NOTE — Telephone Encounter (Signed)
(  pt aware you are out of the office) Pt called to update her cycles patient took provera 10 mg x 10days on 03/22/13 had cycle on 04/01/13 that cycle stopped on 04/10/13. Had another cycle on 04/17/13 which lasted until 04/22/13, no further bleeding c/o very minimum hot flashes x 2 days. She asked if provera should kept on hand if case no cycle? Please advise

## 2013-06-26 MED ORDER — MEDROXYPROGESTERONE ACETATE 10 MG PO TABS: 10.0000 mg | ORAL_TABLET | Freq: Every day | ORAL | Status: DC

## 2013-06-26 NOTE — Telephone Encounter (Signed)
Okay for Provera 10 mg x10 days if she does not have a menses by 8 weeks. Refill x4

## 2013-06-26 NOTE — Telephone Encounter (Signed)
Left on voicemail this was sent and the below left as well.

## 2013-06-28 ENCOUNTER — Other Ambulatory Visit: Payer: Self-pay

## 2013-06-28 MED ORDER — MEDROXYPROGESTERONE ACETATE 10 MG PO TABS: 10.0000 mg | ORAL_TABLET | Freq: Every day | ORAL | Status: DC

## 2013-06-28 NOTE — Telephone Encounter (Signed)
At 09/06/12 RGCE you wrote "Irregular menses. Patient apparently had gone through premature menopause with elevated FSH in the past but then resumed menstrual function. She now has some mild irregularity where she may skip one month. Dr. Eda PaschalGottsegen had given her Provera to withdraw if she does more than 2 months without a menses. She has not used it this past year. No intermenstrual bleeding. We'll recheck her TSH and FSH now. She has the blood drawn at her office I gave her prescription and it will be called back to us. Options for management to include low-dose oral contraceptives for both menstrual regulation, contraception and occasional hot flash control. Alternatives such as Mirena IUD also discussed. Patient is not interested in doing anything at this point but monitoring and keeping a menstrual calendar. She will call me if she goes more than 2 months without menses for the Provera."

## 2013-06-29 ENCOUNTER — Other Ambulatory Visit: Payer: Self-pay

## 2013-06-29 ENCOUNTER — Other Ambulatory Visit: Payer: Self-pay | Admitting: Gynecology

## 2013-08-08 ENCOUNTER — Telehealth: Payer: Self-pay | Admitting: *Deleted

## 2013-08-08 NOTE — Telephone Encounter (Signed)
Pt was prescribed Provera 10 mg x10 days if she does not have a menses by 8 weeks on telephone encounter 06/23/13. Pt took last pill on 07/29/13 and no cycle yet. I explained to patient to wait 2 weeks from last pill if no cycle then to call. Pt will follow up.

## 2013-08-14 ENCOUNTER — Telehealth: Payer: Self-pay

## 2013-08-14 NOTE — Telephone Encounter (Signed)
That is okay if she does not have any menses after the Provera. She probably did not grow enough lining in the uterus to shed. Will discuss at her annual exam.

## 2013-08-14 NOTE — Telephone Encounter (Signed)
Patient took last Provera on May 16 and no menses yet. She is coming to see you on June 30 for CE and thought you might want her to have period before then.  [name] had told her to call if not menses within 2 weeks of last pill.

## 2013-08-14 NOTE — Telephone Encounter (Signed)
Left detailed message on cell voicemail.

## 2013-09-05 ENCOUNTER — Telehealth: Payer: Self-pay | Admitting: *Deleted

## 2013-09-05 NOTE — Telephone Encounter (Signed)
Left message for pt to give me fax # to send order.

## 2013-09-05 NOTE — Telephone Encounter (Signed)
Pt has annual scheduled on 09/12/13, pt would like blood work done prior to at her job. Pt will have results faxed to office. Please advise

## 2013-09-05 NOTE — Telephone Encounter (Signed)
Normally I would do CBC comprehensive metabolic panel lipid profile TSH urinalysis

## 2013-09-06 NOTE — Telephone Encounter (Signed)
Okay for vitamin D and FSH

## 2013-09-06 NOTE — Telephone Encounter (Signed)
Order faxed to [phone].

## 2013-09-06 NOTE — Telephone Encounter (Signed)
Pt would like FSH level drawn as well due having slightly more hot flashes. And vit d level as well.

## 2013-09-12 ENCOUNTER — Other Ambulatory Visit (HOSPITAL_COMMUNITY)
Admission: RE | Admit: 2013-09-12 | Discharge: 2013-09-12 | Disposition: A | Discharge status: Home / Self Care | Payer: BC Managed Care – PPO | Source: Ambulatory Visit | Attending: Gynecology | Admitting: Gynecology

## 2013-09-12 ENCOUNTER — Encounter: Payer: Self-pay | Admitting: Gynecology

## 2013-09-12 ENCOUNTER — Ambulatory Visit (INDEPENDENT_AMBULATORY_CARE_PROVIDER_SITE_OTHER): Payer: BC Managed Care – PPO | Admitting: Gynecology

## 2013-09-12 VITALS — BP 120/74 | Ht 66.0 in | Wt 139.0 lb

## 2013-09-12 DIAGNOSIS — Z01419 Encounter for gynecological examination (general) (routine) without abnormal findings: Secondary | ICD-10-CM

## 2013-09-12 NOTE — Patient Instructions (Addendum)
Schedule a bone density at work.  Keep a menstrual calendar. As long as less frequent but regular menses and will monitor. If prolonged or atypical bleeding call.  You may obtain a copy of any labs that were done today by logging onto MyChart as outlined in the instructions provided with your AVS (after visit summary). The office will not call with normal lab results but certainly if there are any significant abnormalities then we will contact you.   Health Maintenance, Female A healthy lifestyle and preventative care can promote health and wellness.  Maintain regular health, dental, and eye exams.  Eat a healthy diet. Foods like vegetables, fruits, whole grains, low-fat dairy products, and lean protein foods contain the nutrients you need without too many calories. Decrease your intake of foods high in solid fats, added sugars, and salt. Get information about a proper diet from your caregiver, if necessary.  Regular physical exercise is one of the most important things you can do for your health. Most adults should get at least 150 minutes of moderate-intensity exercise (any activity that increases your heart rate and causes you to sweat) each week. In addition, most adults need muscle-strengthening exercises on 2 or more days a week.   Maintain a healthy weight. The body mass index (BMI) is a screening tool to identify possible weight problems. It provides an estimate of body fat based on height and weight. Your caregiver can help determine your BMI, and can help you achieve or maintain a healthy weight. For adults 20 years and older:  A BMI below 18.5 is considered underweight.  A BMI of 18.5 to 24.9 is normal.  A BMI of 25 to 29.9 is considered overweight.  A BMI of 30 and above is considered obese.  Maintain normal blood lipids and cholesterol by exercising and minimizing your intake of saturated fat. Eat a balanced diet with plenty of fruits and vegetables. Blood tests for lipids and  cholesterol should begin at age 64 and be repeated every 5 years. If your lipid or cholesterol levels are high, you are over 50, or you are a high risk for heart disease, you may need your cholesterol levels checked more frequently.Ongoing high lipid and cholesterol levels should be treated with medicines if diet and exercise are not effective.  If you smoke, find out from your caregiver how to quit. If you do not use tobacco, do not start.  Lung cancer screening is recommended for adults aged 40 80 years who are at high risk for developing lung cancer because of a history of smoking. Yearly low-dose computed tomography (CT) is recommended for people who have at least a 30-pack-year history of smoking and are a current smoker or have quit within the past 15 years. A pack year of smoking is smoking an average of 1 pack of cigarettes a day for 1 year (for example: 1 pack a day for 30 years or 2 packs a day for 15 years). Yearly screening should continue until the smoker has stopped smoking for at least 15 years. Yearly screening should also be stopped for people who develop a health problem that would prevent them from having lung cancer treatment.  If you are pregnant, do not drink alcohol. If you are breastfeeding, be very cautious about drinking alcohol. If you are not pregnant and choose to drink alcohol, do not exceed 1 drink per day. One drink is considered to be 12 ounces (355 mL) of beer, 5 ounces (148 mL) of wine, or  1.5 ounces (44 mL) of liquor.  Avoid use of street drugs. Do not share needles with anyone. Ask for help if you need support or instructions about stopping the use of drugs.  High blood pressure causes heart disease and increases the risk of stroke. Blood pressure should be checked at least every 1 to 2 years. Ongoing high blood pressure should be treated with medicines, if weight loss and exercise are not effective.  If you are 45 to 49 years old, ask your caregiver if you should  take aspirin to prevent strokes.  Diabetes screening involves taking a blood sample to check your fasting blood sugar level. This should be done once every 3 years, after age 41, if you are within normal weight and without risk factors for diabetes. Testing should be considered at a younger age or be carried out more frequently if you are overweight and have at least 1 risk factor for diabetes.  Breast cancer screening is essential preventative care for women. You should practice "breast self-awareness." This means understanding the normal appearance and feel of your breasts and may include breast self-examination. Any changes detected, no matter how small, should be reported to a caregiver. Women in their 58s and 30s should have a clinical breast exam (CBE) by a caregiver as part of a regular health exam every 1 to 3 years. After age 31, women should have a CBE every year. Starting at age 15, women should consider having a mammogram (breast X-ray) every year. Women who have a family history of breast cancer should talk to their caregiver about genetic screening. Women at a high risk of breast cancer should talk to their caregiver about having an MRI and a mammogram every year.  Breast cancer gene (BRCA)-related cancer risk assessment is recommended for women who have family members with BRCA-related cancers. BRCA-related cancers include breast, ovarian, tubal, and peritoneal cancers. Having family members with these cancers may be associated with an increased risk for harmful changes (mutations) in the breast cancer genes BRCA1 and BRCA2. Results of the assessment will determine the need for genetic counseling and BRCA1 and BRCA2 testing.  The Pap test is a screening test for cervical cancer. Women should have a Pap test starting at age 70. Between ages 69 and 5, Pap tests should be repeated every 2 years. Beginning at age 64, you should have a Pap test every 3 years as long as the past 3 Pap tests have  been normal. If you had a hysterectomy for a problem that was not cancer or a condition that could lead to cancer, then you no longer need Pap tests. If you are between ages 9 and 88, and you have had normal Pap tests going back 10 years, you no longer need Pap tests. If you have had past treatment for cervical cancer or a condition that could lead to cancer, you need Pap tests and screening for cancer for at least 20 years after your treatment. If Pap tests have been discontinued, risk factors (such as a new sexual partner) need to be reassessed to determine if screening should be resumed. Some women have medical problems that increase the chance of getting cervical cancer. In these cases, your caregiver may recommend more frequent screening and Pap tests.  The human papillomavirus (HPV) test is an additional test that may be used for cervical cancer screening. The HPV test looks for the virus that can cause the cell changes on the cervix. The cells collected during the Pap  test can be tested for HPV. The HPV test could be used to screen women aged 63 years and older, and should be used in women of any age who have unclear Pap test results. After the age of 17, women should have HPV testing at the same frequency as a Pap test.  Colorectal cancer can be detected and often prevented. Most routine colorectal cancer screening begins at the age of 31 and continues through age 90. However, your caregiver may recommend screening at an earlier age if you have risk factors for colon cancer. On a yearly basis, your caregiver may provide home test kits to check for hidden blood in the stool. Use of a small camera at the end of a tube, to directly examine the colon (sigmoidoscopy or colonoscopy), can detect the earliest forms of colorectal cancer. Talk to your caregiver about this at age 19, when routine screening begins. Direct examination of the colon should be repeated every 5 to 10 years through age 21, unless early  forms of pre-cancerous polyps or small growths are found.  Hepatitis C blood testing is recommended for all people born from [birth year] through 1966 and any individual with known risks for hepatitis C.  Practice safe sex. Use condoms and avoid high-risk sexual practices to reduce the spread of sexually transmitted infections (STIs). Sexually active women aged 12 and younger should be checked for Chlamydia, which is a common sexually transmitted infection. Older women with new or multiple partners should also be tested for Chlamydia. Testing for other STIs is recommended if you are sexually active and at increased risk.  Osteoporosis is a disease in which the bones lose minerals and strength with aging. This can result in serious bone fractures. The risk of osteoporosis can be identified using a bone density scan. Women ages 16 and over and women at risk for fractures or osteoporosis should discuss screening with their caregivers. Ask your caregiver whether you should be taking a calcium supplement or vitamin D to reduce the rate of osteoporosis.  Menopause can be associated with physical symptoms and risks. Hormone replacement therapy is available to decrease symptoms and risks. You should talk to your caregiver about whether hormone replacement therapy is right for you.  Use sunscreen. Apply sunscreen liberally and repeatedly throughout the day. You should seek shade when your shadow is shorter than you. Protect yourself by wearing long sleeves, pants, a wide-brimmed hat, and sunglasses year round, whenever you are outdoors.  Notify your caregiver of new moles or changes in moles, especially if there is a change in shape or color. Also notify your caregiver if a mole is larger than the size of a pencil eraser.  Stay current with your immunizations. Document Released: 09/15/2010 Document Revised: 06/27/2012 Document Reviewed: 09/15/2010 [facility] Patient Information 2014 [place].

## 2013-09-12 NOTE — Progress Notes (Signed)
Kimberly LeashLisa C Hoar [birth year] [id number]        48 y.o.  G0P0 for annual exam.  Several issues noted below.  Past medical history,surgical history, problem list, medications, allergies, family history and social history were all reviewed and documented as reviewed in the EPIC chart.  ROS:  12 system ROS performed with pertinent positives and negatives included in the history, assessment and plan.   Additional significant findings :  None   Exam: [name] personassistant Filed Vitals:   09/12/13 1451  BP: 120/74  Height: 5\' 6"  (1.676 m)  Weight: 139 lb (63.05 kg)   General appearance:  Normal affect, orientation and appearance. Skin: Grossly normal HEENT: Without gross lesions.  No cervical or supraclavicular adenopathy. Thyroid normal.  Lungs:  Clear without wheezing, rales or rhonchi Cardiac: RR, without RMG Abdominal:  Soft, nontender, without masses, guarding, rebound, organomegaly or hernia Breasts:  Examined lying and sitting without masses, retractions, discharge or axillary adenopathy. Pelvic:  Ext/BUS/vagina normal  Cervix normal. Pap done  Uterus anteverted, normal size, shape and contour, midline and mobile nontender   Adnexa  Without masses or tenderness    Anus and perineum  Normal   Rectovaginal  Normal sphincter tone without palpated masses or tenderness.    Assessment/Plan:  48 y.o. G0P0 female for annual exam.   1. Perimenopausal. Patient's last period was February 2015. No bleeding since. FSH at work was 140. Is not having significant hot flashes night sweats vaginal dryness or dyspareunia. Will keep menstrual calendar. As long as less frequent but normal menses Will follow. If prolonged or atypical bleeding will call. If goes more than one year and then bleeds patient also knows to call. Followup if significant menopausal symptoms develop and she wants to discuss HRT. 2. DEXA 2011 T score -2.2 Z score -1.7. Ordered when she was diagnosed with premature menopause. Recommend repeat  DEXA now she agrees to schedule. She is going to do this through work. Recent vitamin D 53. Patient will continue on her vitamin D supplement. 3. Contraception. Patient continues to use condoms and she is comfortable with this. We've discussed the failure risk multiple times. 4. Pap smear 2013. Pap smear done today. She is not comfortable with extending the interval too long. No history of abnormal Pap smears previously. 5. Mammography 11/2012. I reminded her to schedule it this coming fall. SBE monthly reviewed. 6. Colonoscopy screening recommendations at age 750 reviewed as she turns 49 this coming year. 7. Health maintenance. No routine blood work drawn as she recently had this done at her primary physician's office.   Note: This document was prepared with digital dictation and possible smart phrase technology. Any transcriptional errors that result from this process are unintentional.   [name],TIMOTHY P MD, 3:52 PM 09/12/2013

## 2013-09-14 LAB — CYTOLOGY - PAP

## 2013-10-09 ENCOUNTER — Telehealth: Payer: Self-pay | Admitting: Gynecology

## 2013-10-09 ENCOUNTER — Encounter: Payer: Self-pay | Admitting: Gynecology

## 2013-10-09 NOTE — Telephone Encounter (Signed)
Tell patient I reviewed her bone density. It appears that she is stable at her spine and total hip. A slight decrease in the neck of the hip. She should be taking a total of 1000 units vitamin D daily which she can take as a separate OTC supplements. She should also be getting a total of 1200 mg of calcium which includes what she gets in her diet.

## 2013-10-09 NOTE — Telephone Encounter (Signed)
Pt informed with the below note. 

## 2013-11-15 ENCOUNTER — Other Ambulatory Visit: Payer: Self-pay

## 2013-11-15 DIAGNOSIS — Z1231 Encounter for screening mammogram for malignant neoplasm of breast: Secondary | ICD-10-CM

## 2013-12-04 ENCOUNTER — Ambulatory Visit
Admission: RE | Admit: 2013-12-04 | Discharge: 2013-12-04 | Disposition: A | Discharge status: Home / Self Care | Payer: BC Managed Care – PPO | Source: Ambulatory Visit

## 2013-12-04 ENCOUNTER — Encounter (INDEPENDENT_AMBULATORY_CARE_PROVIDER_SITE_OTHER): Payer: Self-pay

## 2013-12-04 DIAGNOSIS — Z1231 Encounter for screening mammogram for malignant neoplasm of breast: Secondary | ICD-10-CM

## 2014-08-31 ENCOUNTER — Other Ambulatory Visit: Payer: Self-pay | Admitting: Gastroenterology

## 2014-09-25 ENCOUNTER — Ambulatory Visit (INDEPENDENT_AMBULATORY_CARE_PROVIDER_SITE_OTHER): Payer: BLUE CROSS/BLUE SHIELD | Admitting: Gynecology

## 2014-09-25 ENCOUNTER — Encounter: Payer: Self-pay | Admitting: Gynecology

## 2014-09-25 ENCOUNTER — Other Ambulatory Visit (HOSPITAL_COMMUNITY)
Admission: RE | Admit: 2014-09-25 | Discharge: 2014-09-25 | Disposition: A | Discharge status: Home / Self Care | Payer: BLUE CROSS/BLUE SHIELD | Source: Ambulatory Visit | Attending: Gynecology | Admitting: Gynecology

## 2014-09-25 VITALS — BP 116/76 | Ht 66.0 in | Wt 143.0 lb

## 2014-09-25 DIAGNOSIS — Z01419 Encounter for gynecological examination (general) (routine) without abnormal findings: Secondary | ICD-10-CM | POA: Diagnosis present

## 2014-09-25 DIAGNOSIS — N951 Menopausal and female climacteric states: Secondary | ICD-10-CM

## 2014-09-25 NOTE — Addendum Note (Signed)
Addended by: Dayna BarkerGARDNER, KIMBERLY K on: 09/25/2014 03:26 PM   Modules accepted: Orders

## 2014-09-25 NOTE — Patient Instructions (Signed)
You may obtain a copy of any labs that were done today by logging onto MyChart as outlined in the instructions provided with your AVS (after visit summary). The office will not call with normal lab results but certainly if there are any significant abnormalities then we will contact you.   Health Maintenance, Female A healthy lifestyle and preventative care can promote health and wellness.  Maintain regular health, dental, and eye exams.  Eat a healthy diet. Foods like vegetables, fruits, whole grains, low-fat dairy products, and lean protein foods contain the nutrients you need without too many calories. Decrease your intake of foods high in solid fats, added sugars, and salt. Get information about a proper diet from your caregiver, if necessary.  Regular physical exercise is one of the most important things you can do for your health. Most adults should get at least 150 minutes of moderate-intensity exercise (any activity that increases your heart rate and causes you to sweat) each week. In addition, most adults need muscle-strengthening exercises on 2 or more days a week.   Maintain a healthy weight. The body mass index (BMI) is a screening tool to identify possible weight problems. It provides an estimate of body fat based on height and weight. Your caregiver can help determine your BMI, and can help you achieve or maintain a healthy weight. For adults 20 years and older:  A BMI below 18.5 is considered underweight.  A BMI of 18.5 to 24.9 is normal.  A BMI of 25 to 29.9 is considered overweight.  A BMI of 30 and above is considered obese.  Maintain normal blood lipids and cholesterol by exercising and minimizing your intake of saturated fat. Eat a balanced diet with plenty of fruits and vegetables. Blood tests for lipids and cholesterol should begin at age 61 and be repeated every 5 years. If your lipid or cholesterol levels are high, you are over 50, or you are a high risk for heart  disease, you may need your cholesterol levels checked more frequently.Ongoing high lipid and cholesterol levels should be treated with medicines if diet and exercise are not effective.  If you smoke, find out from your caregiver how to quit. If you do not use tobacco, do not start.  Lung cancer screening is recommended for adults aged 33 80 years who are at high risk for developing lung cancer because of a history of smoking. Yearly low-dose computed tomography (CT) is recommended for people who have at least a 30-pack-year history of smoking and are a current smoker or have quit within the past 15 years. A pack year of smoking is smoking an average of 1 pack of cigarettes a day for 1 year (for example: 1 pack a day for 30 years or 2 packs a day for 15 years). Yearly screening should continue until the smoker has stopped smoking for at least 15 years. Yearly screening should also be stopped for people who develop a health problem that would prevent them from having lung cancer treatment.  If you are pregnant, do not drink alcohol. If you are breastfeeding, be very cautious about drinking alcohol. If you are not pregnant and choose to drink alcohol, do not exceed 1 drink per day. One drink is considered to be 12 ounces (355 mL) of beer, 5 ounces (148 mL) of wine, or 1.5 ounces (44 mL) of liquor.  Avoid use of street drugs. Do not share needles with anyone. Ask for help if you need support or instructions about stopping  the use of drugs.  High blood pressure causes heart disease and increases the risk of stroke. Blood pressure should be checked at least every 1 to 2 years. Ongoing high blood pressure should be treated with medicines, if weight loss and exercise are not effective.  If you are 59 to 50 years old, ask your caregiver if you should take aspirin to prevent strokes.  Diabetes screening involves taking a blood sample to check your fasting blood sugar level. This should be done once every 3  years, after age 91, if you are within normal weight and without risk factors for diabetes. Testing should be considered at a younger age or be carried out more frequently if you are overweight and have at least 1 risk factor for diabetes.  Breast cancer screening is essential preventative care for women. You should practice "breast self-awareness." This means understanding the normal appearance and feel of your breasts and may include breast self-examination. Any changes detected, no matter how small, should be reported to a caregiver. Women in their 66s and 30s should have a clinical breast exam (CBE) by a caregiver as part of a regular health exam every 1 to 3 years. After age 101, women should have a CBE every year. Starting at age 100, women should consider having a mammogram (breast X-ray) every year. Women who have a family history of breast cancer should talk to their caregiver about genetic screening. Women at a high risk of breast cancer should talk to their caregiver about having an MRI and a mammogram every year.  Breast cancer gene (BRCA)-related cancer risk assessment is recommended for women who have family members with BRCA-related cancers. BRCA-related cancers include breast, ovarian, tubal, and peritoneal cancers. Having family members with these cancers may be associated with an increased risk for harmful changes (mutations) in the breast cancer genes BRCA1 and BRCA2. Results of the assessment will determine the need for genetic counseling and BRCA1 and BRCA2 testing.  The Pap test is a screening test for cervical cancer. Women should have a Pap test starting at age 57. Between ages 25 and 35, Pap tests should be repeated every 2 years. Beginning at age 37, you should have a Pap test every 3 years as long as the past 3 Pap tests have been normal. If you had a hysterectomy for a problem that was not cancer or a condition that could lead to cancer, then you no longer need Pap tests. If you are  between ages 50 and 76, and you have had normal Pap tests going back 10 years, you no longer need Pap tests. If you have had past treatment for cervical cancer or a condition that could lead to cancer, you need Pap tests and screening for cancer for at least 20 years after your treatment. If Pap tests have been discontinued, risk factors (such as a new sexual partner) need to be reassessed to determine if screening should be resumed. Some women have medical problems that increase the chance of getting cervical cancer. In these cases, your caregiver may recommend more frequent screening and Pap tests.  The human papillomavirus (HPV) test is an additional test that may be used for cervical cancer screening. The HPV test looks for the virus that can cause the cell changes on the cervix. The cells collected during the Pap test can be tested for HPV. The HPV test could be used to screen women aged 44 years and older, and should be used in women of any age  who have unclear Pap test results. After the age of 55, women should have HPV testing at the same frequency as a Pap test.  Colorectal cancer can be detected and often prevented. Most routine colorectal cancer screening begins at the age of 44 and continues through age 20. However, your caregiver may recommend screening at an earlier age if you have risk factors for colon cancer. On a yearly basis, your caregiver may provide home test kits to check for hidden blood in the stool. Use of a small camera at the end of a tube, to directly examine the colon (sigmoidoscopy or colonoscopy), can detect the earliest forms of colorectal cancer. Talk to your caregiver about this at age 86, when routine screening begins. Direct examination of the colon should be repeated every 5 to 10 years through age 13, unless early forms of pre-cancerous polyps or small growths are found.  Hepatitis C blood testing is recommended for all people born from [birth year] through 1966 and any  individual with known risks for hepatitis C.  Practice safe sex. Use condoms and avoid high-risk sexual practices to reduce the spread of sexually transmitted infections (STIs). Sexually active women aged 36 and younger should be checked for Chlamydia, which is a common sexually transmitted infection. Older women with new or multiple partners should also be tested for Chlamydia. Testing for other STIs is recommended if you are sexually active and at increased risk.  Osteoporosis is a disease in which the bones lose minerals and strength with aging. This can result in serious bone fractures. The risk of osteoporosis can be identified using a bone density scan. Women ages 20 and over and women at risk for fractures or osteoporosis should discuss screening with their caregivers. Ask your caregiver whether you should be taking a calcium supplement or vitamin D to reduce the rate of osteoporosis.  Menopause can be associated with physical symptoms and risks. Hormone replacement therapy is available to decrease symptoms and risks. You should talk to your caregiver about whether hormone replacement therapy is right for you.  Use sunscreen. Apply sunscreen liberally and repeatedly throughout the day. You should seek shade when your shadow is shorter than you. Protect yourself by wearing long sleeves, pants, a wide-brimmed hat, and sunglasses year round, whenever you are outdoors.  Notify your caregiver of new moles or changes in moles, especially if there is a change in shape or color. Also notify your caregiver if a mole is larger than the size of a pencil eraser.  Stay current with your immunizations. Document Released: 09/15/2010 Document Revised: 06/27/2012 Document Reviewed: 09/15/2010 [facility] Patient Information 2014 [employer].

## 2014-09-25 NOTE — Progress Notes (Signed)
[name] 1965/07/12 [id number]        49 y.o.  G0P0 for annual exam. Several issues noted below.  Past medical history,surgical history, problem list, medications, allergies, family history and social history were all reviewed and documented as reviewed in the EPIC chart.  ROS:  Performed with pertinent positives and negatives included in the history, assessment and plan.   Additional significant findings :  none   Exam: [name] personassistant Filed Vitals:   09/25/14 1354  BP: 116/76  Height: 5\' 6"  (1.676 m)  Weight: 143 lb (64.864 kg)   General appearance:  Normal affect, orientation and appearance. Skin: Grossly normal HEENT: Without gross lesions.  No cervical or supraclavicular adenopathy. Thyroid normal.  Lungs:  Clear without wheezing, rales or rhonchi Cardiac: RR, without RMG Abdominal:  Soft, nontender, without masses, guarding, rebound, organomegaly or hernia Breasts:  Examined lying and sitting without masses, retractions, discharge or axillary adenopathy. Pelvic:  Ext/BUS/vagina normal  Cervix normal. Pap smear done  Uterus anteverted, normal size, shape and contour, midline and mobile nontender   Adnexa  Without masses or tenderness    Anus and perineum  Normal   Rectovaginal  Normal sphincter tone without palpated masses or tenderness.    Assessment/Plan:  49 y.o. G0P0 female for annual exam.   1. Menopausal symptoms. Patient reports last menses 04/2013 with no bleeding since. Has started having hot flashes and night sweats. Options for management to include OTC products versus HRT discussed. Patient not interested in HRT at this point. Wants to try soy-based products. Will follow up if symptoms worsen or wants to rediscuss HRT. No vaginal bleeding at all and she knows the importance of reporting any vaginal bleeding. 2. Osteopenia. DEXA 09/2013 T score -2.3 area in stable from prior DEXA. Plan repeat DEXA next year at two-year interval. Increase calcium vitamin D reviewed.  Check vitamin D level. 3. Mammography 11/2013. Continue with annual mammography. SBE monthly reviewed. 4. Pap smear 2015.Pap smear done today at her request. She is nervous about spacing them out per current screening guidelines. No history of significant abnormal Pap smears previously. 5. Colonoscopy 2016. They found several benign polyps. Recommended to be repeated in 5 years. 6. Health maintenance. Initially put orders in for CBC, comprehensive metabolic panel, lipid profile, vitamin D, TSH and urinalysis. Patient's subsequently wanted to have done at her work and a list of the requested labs were given to her and she is going to have this drawn at work and the results sent to me. Follow up in one year, sooner if her menopausal symptoms worsen and she wants to rediscuss treatment options.   [name],TIMOTHY P MD, 2:57 PM 09/25/2014

## 2014-09-26 LAB — URINALYSIS W MICROSCOPIC + REFLEX CULTURE
BILIRUBIN URINE: NEGATIVE
Bacteria, UA: NONE SEEN
CASTS: NONE SEEN
Crystals: NONE SEEN
GLUCOSE, UA: NEGATIVE mg/dL
Hgb urine dipstick: NEGATIVE
KETONES UR: NEGATIVE mg/dL
LEUKOCYTES UA: NEGATIVE
Nitrite: NEGATIVE
Protein, ur: NEGATIVE mg/dL
SPECIFIC GRAVITY, URINE: 1.024 (ref 1.005–1.030)
Squamous Epithelial / LPF: NONE SEEN
Urobilinogen, UA: 0.2 mg/dL (ref 0.0–1.0)
pH: 5.5 (ref 5.0–8.0)

## 2014-09-27 ENCOUNTER — Encounter: Payer: Self-pay | Admitting: Family Medicine

## 2014-09-27 LAB — CYTOLOGY - PAP

## 2014-11-07 ENCOUNTER — Other Ambulatory Visit: Payer: Self-pay

## 2014-11-07 DIAGNOSIS — Z1231 Encounter for screening mammogram for malignant neoplasm of breast: Secondary | ICD-10-CM

## 2014-12-17 ENCOUNTER — Ambulatory Visit: Payer: BLUE CROSS/BLUE SHIELD

## 2014-12-27 ENCOUNTER — Ambulatory Visit
Admission: RE | Admit: 2014-12-27 | Discharge: 2014-12-27 | Disposition: A | Discharge status: Home / Self Care | Payer: BLUE CROSS/BLUE SHIELD | Source: Ambulatory Visit

## 2014-12-27 DIAGNOSIS — Z1231 Encounter for screening mammogram for malignant neoplasm of breast: Secondary | ICD-10-CM

## 2015-03-06 ENCOUNTER — Telehealth: Payer: Self-pay | Admitting: *Deleted

## 2015-03-06 NOTE — Telephone Encounter (Signed)
Pt called regarding taking Reminfen OTC soy product for hot flashes and Sx's of menopause. She states it seems to be helping and will monitor symptoms if works will continue it and if doesn't work will call back [employer] C[employer]

## 2015-05-06 ENCOUNTER — Telehealth: Payer: Self-pay | Admitting: *Deleted

## 2015-05-06 DIAGNOSIS — N951 Menopausal and female climacteric states: Secondary | ICD-10-CM

## 2015-05-06 NOTE — Telephone Encounter (Signed)
If you review side effects of black cohosh you will see listed a variety ranging from low blood pressure to rashes to liver dysfunction. It is generally considered safe to use. I think if she has any concern I would recommend doing a comprehensive metabolic panel which a look at electrolytes, renal function and liver function

## 2015-05-06 NOTE — Telephone Encounter (Signed)
Pt called has been taking OTC Remifemin one pill daily started in 03/01/15 (box states take 1 pill in am, and 1 pill in pm.) pt said she has done well last couple of months, but recently flashes have increased. She will try 2 pills daily as noted, her concern is her liver states she read something about liver damage with taking medication. Pt wanted your opinion if you think any blood work should be done, annual due in July. Please advise

## 2015-05-07 NOTE — Telephone Encounter (Signed)
Left detailed message on pt voicemail with the below and told her to call to schedule lab appointment.

## 2015-05-07 NOTE — Telephone Encounter (Signed)
Pt called back and would to have labs drawn at her job, she asked I fax to atten: Dr.John Walker at [phone]. Order will be faxed.

## 2015-05-07 NOTE — Addendum Note (Signed)
Addended by: Aura Camps on: 05/07/2015 09:48 AM   Modules accepted: Orders

## 2015-08-19 DIAGNOSIS — L309 Dermatitis, unspecified: Secondary | ICD-10-CM | POA: Insufficient documentation

## 2015-08-19 DIAGNOSIS — K219 Gastro-esophageal reflux disease without esophagitis: Secondary | ICD-10-CM | POA: Insufficient documentation

## 2015-08-19 DIAGNOSIS — J309 Allergic rhinitis, unspecified: Secondary | ICD-10-CM | POA: Insufficient documentation

## 2015-08-19 DIAGNOSIS — J45909 Unspecified asthma, uncomplicated: Secondary | ICD-10-CM | POA: Insufficient documentation

## 2015-08-19 LAB — BASIC METABOLIC PANEL
BUN: 18 mg/dL (ref 4–21)
CREATININE: 0.6 mg/dL (ref 0.5–1.1)
GLUCOSE: 93 mg/dL
Potassium: 4 mmol/L (ref 3.4–5.3)
Sodium: 140 mmol/L (ref 137–147)

## 2015-08-19 LAB — TSH: TSH: 3.05 u[IU]/mL (ref 0.41–5.90)

## 2015-08-19 LAB — HEPATIC FUNCTION PANEL
ALK PHOS: 64 U/L (ref 25–125)
ALT: 9 U/L (ref 7–35)
AST: 17 U/L (ref 13–35)
Bilirubin, Total: 1 mg/dL

## 2015-08-19 LAB — LIPID PANEL
CHOLESTEROL: 209 mg/dL — AB (ref 0–200)
HDL: 96 mg/dL — AB (ref 35–70)
LDL CALC: 102 mg/dL
LDl/HDL Ratio: 2.2
TRIGLYCERIDES: 56 mg/dL (ref 40–160)

## 2015-08-19 LAB — CBC AND DIFFERENTIAL
HCT: 40 % (ref 36–46)
HEMOGLOBIN: 13.2 g/dL (ref 12.0–16.0)
PLATELETS: 232 10*3/uL (ref 150–399)
WBC: 4.3 10*3/mL

## 2015-08-20 ENCOUNTER — Ambulatory Visit: Payer: Self-pay | Admitting: Family Medicine

## 2015-09-26 ENCOUNTER — Encounter: Payer: Self-pay | Admitting: Gynecology

## 2015-09-26 ENCOUNTER — Ambulatory Visit (INDEPENDENT_AMBULATORY_CARE_PROVIDER_SITE_OTHER): Payer: Managed Care, Other (non HMO) | Admitting: Gynecology

## 2015-09-26 VITALS — BP 120/74 | Ht 66.0 in | Wt 141.0 lb

## 2015-09-26 DIAGNOSIS — N951 Menopausal and female climacteric states: Secondary | ICD-10-CM | POA: Diagnosis not present

## 2015-09-26 DIAGNOSIS — M8580 Other specified disorders of bone density and structure, unspecified site: Secondary | ICD-10-CM

## 2015-09-26 DIAGNOSIS — Z01419 Encounter for gynecological examination (general) (routine) without abnormal findings: Secondary | ICD-10-CM | POA: Diagnosis not present

## 2015-09-26 NOTE — Addendum Note (Signed)
Addended by: Dayna BarkerGARDNER, Tayshun Gappa K on: 09/26/2015 03:55 PM   Modules accepted: Orders, SmartSet

## 2015-09-26 NOTE — Patient Instructions (Signed)
Follow up to have your vitamin D checked at work. Follow up the schedule the bone density at work.  You may obtain a copy of any labs that were done today by logging onto MyChart as outlined in the instructions provided with your AVS (after visit summary). The office will not call with normal lab results but certainly if there are any significant abnormalities then we will contact you.   Health Maintenance Adopting a healthy lifestyle and getting preventive care can go a long way to promote health and wellness. Talk with your health care provider about what schedule of regular examinations is right for you. This is a good chance for you to check in with your provider about disease prevention and staying healthy. In between checkups, there are plenty of things you can do on your own. Experts have done a lot of research about which lifestyle changes and preventive measures are most likely to keep you healthy. Ask your health care provider for more information. WEIGHT AND DIET  Eat a healthy diet  Be sure to include plenty of vegetables, fruits, low-fat dairy products, and lean protein.  Do not eat a lot of foods high in solid fats, added sugars, or salt.  Get regular exercise. This is one of the most important things you can do for your health.  Most adults should exercise for at least 150 minutes each week. The exercise should increase your heart rate and make you sweat (moderate-intensity exercise).  Most adults should also do strengthening exercises at least twice a week. This is in addition to the moderate-intensity exercise.  Maintain a healthy weight  Body mass index (BMI) is a measurement that can be used to identify possible weight problems. It estimates body fat based on height and weight. Your health care provider can help determine your BMI and help you achieve or maintain a healthy weight.  For females 84 years of age and older:   A BMI below 18.5 is considered underweight.  A  BMI of 18.5 to 24.9 is normal.  A BMI of 25 to 29.9 is considered overweight.  A BMI of 30 and above is considered obese.  Watch levels of cholesterol and blood lipids  You should start having your blood tested for lipids and cholesterol at 51 years of age, then have this test every 5 years.  You may need to have your cholesterol levels checked more often if:  Your lipid or cholesterol levels are high.  You are older than 52 years of age.  You are at high risk for heart disease.  CANCER SCREENING   Lung Cancer  Lung cancer screening is recommended for adults 39-61 years old who are at high risk for lung cancer because of a history of smoking.  A yearly low-dose CT scan of the lungs is recommended for people who:  Currently smoke.  Have quit within the past 15 years.  Have at least a 30-pack-year history of smoking. A pack year is smoking an average of one pack of cigarettes a day for 1 year.  Yearly screening should continue until it has been 15 years since you quit.  Yearly screening should stop if you develop a health problem that would prevent you from having lung cancer treatment.  Breast Cancer  Practice breast self-awareness. This means understanding how your breasts normally appear and feel.  It also means doing regular breast self-exams. Let your health care provider know about any changes, no matter how small.  If you are  in your 2s or 30s, you should have a clinical breast exam (CBE) by a health care provider every 1-3 years as part of a regular health exam.  If you are 59 or older, have a CBE every year. Also consider having a breast X-ray (mammogram) every year.  If you have a family history of breast cancer, talk to your health care provider about genetic screening.  If you are at high risk for breast cancer, talk to your health care provider about having an MRI and a mammogram every year.  Breast cancer gene (BRCA) assessment is recommended for women  who have family members with BRCA-related cancers. BRCA-related cancers include:  Breast.  Ovarian.  Tubal.  Peritoneal cancers.  Results of the assessment will determine the need for genetic counseling and BRCA1 and BRCA2 testing. Cervical Cancer Routine pelvic examinations to screen for cervical cancer are no longer recommended for nonpregnant women who are considered low risk for cancer of the pelvic organs (ovaries, uterus, and vagina) and who do not have symptoms. A pelvic examination may be necessary if you have symptoms including those associated with pelvic infections. Ask your health care provider if a screening pelvic exam is right for you.   The Pap test is the screening test for cervical cancer for women who are considered at risk.  If you had a hysterectomy for a problem that was not cancer or a condition that could lead to cancer, then you no longer need Pap tests.  If you are older than 65 years, and you have had normal Pap tests for the past 10 years, you no longer need to have Pap tests.  If you have had past treatment for cervical cancer or a condition that could lead to cancer, you need Pap tests and screening for cancer for at least 20 years after your treatment.  If you no longer get a Pap test, assess your risk factors if they change (such as having a new sexual partner). This can affect whether you should start being screened again.  Some women have medical problems that increase their chance of getting cervical cancer. If this is the case for you, your health care provider may recommend more frequent screening and Pap tests.  The human papillomavirus (HPV) test is another test that may be used for cervical cancer screening. The HPV test looks for the virus that can cause cell changes in the cervix. The cells collected during the Pap test can be tested for HPV.  The HPV test can be used to screen women 26 years of age and older. Getting tested for HPV can extend the  interval between normal Pap tests from three to five years.  An HPV test also should be used to screen women of any age who have unclear Pap test results.  After 51 years of age, women should have HPV testing as often as Pap tests.  Colorectal Cancer  This type of cancer can be detected and often prevented.  Routine colorectal cancer screening usually begins at 51 years of age and continues through 51 years of age.  Your health care provider may recommend screening at an earlier age if you have risk factors for colon cancer.  Your health care provider may also recommend using home test kits to check for hidden blood in the stool.  A small camera at the end of a tube can be used to examine your colon directly (sigmoidoscopy or colonoscopy). This is done to check for the earliest forms  of colorectal cancer.  Routine screening usually begins at age 37.  Direct examination of the colon should be repeated every 5-10 years through 51 years of age. However, you may need to be screened more often if early forms of precancerous polyps or small growths are found. Skin Cancer  Check your skin from head to toe regularly.  Tell your health care provider about any new moles or changes in moles, especially if there is a change in a mole's shape or color.  Also tell your health care provider if you have a mole that is larger than the size of a pencil eraser.  Always use sunscreen. Apply sunscreen liberally and repeatedly throughout the day.  Protect yourself by wearing long sleeves, pants, a wide-brimmed hat, and sunglasses whenever you are outside. HEART DISEASE, DIABETES, AND HIGH BLOOD PRESSURE   Have your blood pressure checked at least every 1-2 years. High blood pressure causes heart disease and increases the risk of stroke.  If you are between 80 years and 2 years old, ask your health care provider if you should take aspirin to prevent strokes.  Have regular diabetes screenings. This  involves taking a blood sample to check your fasting blood sugar level.  If you are at a normal weight and have a low risk for diabetes, have this test once every three years after 51 years of age.  If you are overweight and have a high risk for diabetes, consider being tested at a younger age or more often. PREVENTING INFECTION  Hepatitis B  If you have a higher risk for hepatitis B, you should be screened for this virus. You are considered at high risk for hepatitis B if:  You were born in a country where hepatitis B is common. Ask your health care provider which countries are considered high risk.  Your parents were born in a high-risk country, and you have not been immunized against hepatitis B (hepatitis B vaccine).  You have HIV or AIDS.  You use needles to inject street drugs.  You live with someone who has hepatitis B.  You have had sex with someone who has hepatitis B.  You get hemodialysis treatment.  You take certain medicines for conditions, including cancer, organ transplantation, and autoimmune conditions. Hepatitis C  Blood testing is recommended for:  Everyone born from [birth year] through 1966.  Anyone with known risk factors for hepatitis C. Sexually transmitted infections (STIs)  You should be screened for sexually transmitted infections (STIs) including gonorrhea and chlamydia if:  You are sexually active and are younger than 51 years of age.  You are older than 51 years of age and your health care provider tells you that you are at risk for this type of infection.  Your sexual activity has changed since you were last screened and you are at an increased risk for chlamydia or gonorrhea. Ask your health care provider if you are at risk.  If you do not have HIV, but are at risk, it may be recommended that you take a prescription medicine daily to prevent HIV infection. This is called pre-exposure prophylaxis (PrEP). You are considered at risk if:  You are  sexually active and do not regularly use condoms or know the HIV status of your partner(s).  You take drugs by injection.  You are sexually active with a partner who has HIV. Talk with your health care provider about whether you are at high risk of being infected with HIV. If you choose to begin  PrEP, you should first be tested for HIV. You should then be tested every 3 months for as long as you are taking PrEP.  PREGNANCY   If you are premenopausal and you may become pregnant, ask your health care provider about preconception counseling.  If you may become pregnant, take 400 to 800 micrograms (mcg) of folic acid every day.  If you want to prevent pregnancy, talk to your health care provider about birth control (contraception). OSTEOPOROSIS AND MENOPAUSE   Osteoporosis is a disease in which the bones lose minerals and strength with aging. This can result in serious bone fractures. Your risk for osteoporosis can be identified using a bone density scan.  If you are 57 years of age or older, or if you are at risk for osteoporosis and fractures, ask your health care provider if you should be screened.  Ask your health care provider whether you should take a calcium or vitamin D supplement to lower your risk for osteoporosis.  Menopause may have certain physical symptoms and risks.  Hormone replacement therapy may reduce some of these symptoms and risks. Talk to your health care provider about whether hormone replacement therapy is right for you.  HOME CARE INSTRUCTIONS   Schedule regular health, dental, and eye exams.  Stay current with your immunizations.   Do not use any tobacco products including cigarettes, chewing tobacco, or electronic cigarettes.  If you are pregnant, do not drink alcohol.  If you are breastfeeding, limit how much and how often you drink alcohol.  Limit alcohol intake to no more than 1 drink per day for nonpregnant women. One drink equals 12 ounces of beer, 5  ounces of wine, or 1 ounces of hard liquor.  Do not use street drugs.  Do not share needles.  Ask your health care provider for help if you need support or information about quitting drugs.  Tell your health care provider if you often feel depressed.  Tell your health care provider if you have ever been abused or do not feel safe at home. Document Released: 09/15/2010 Document Revised: 07/17/2013 Document Reviewed: 02/01/2013 [facility] Patient Information 2015 [place], [place]. This information is not intended to replace advice given to you by your health care provider. Make sure you discuss any questions you have with your health care provider.

## 2015-09-26 NOTE — Progress Notes (Signed)
    [name] Apr 30, 1965 [id number]        50 y.o.  G0P0  for annual exam.  Doing well. Several issues noted below.  Past medical history,surgical history, problem list, medications, allergies, family history and social history were all reviewed and documented as reviewed in the EPIC chart.  ROS:  Performed with pertinent positives and negatives included in the history, assessment and plan.   Additional significant findings :  none   Exam: Kennon PortelaKim Gardner assistant Filed Vitals:   09/26/15 1454  BP: 120/74  Height: 5\' 6"  (1.676 m)  Weight: 141 lb (63.957 kg)   General appearance:  Normal affect, orientation and appearance. Skin: Grossly normal HEENT: Without gross lesions.  No cervical or supraclavicular adenopathy. Thyroid normal.  Lungs:  Clear without wheezing, rales or rhonchi Cardiac: RR, without RMG Abdominal:  Soft, nontender, without masses, guarding, rebound, organomegaly or hernia Breasts:  Examined lying and sitting without masses, retractions, discharge or axillary adenopathy. Pelvic:  Ext/BUS/Vagina normal  Cervix normal  Uterus anteverted, normal size, shape and contour, midline and mobile nontender   Adnexa without masses or tenderness    Anus and perineum normal   Rectovaginal normal sphincter tone without palpated masses or tenderness.    Assessment/Plan:  50 y.o. G0P0 female for annual exam.   1. postmenopausal. Patient is having some menopausal symptoms but they are tolerable. Primarily hot flushes and sweats.  No vaginal bleeding. OTC products with some success. We'll follow for now and call if she wants to rediscuss HRT. Patient knows to report any bleeding. 2. Osteopenia. DEXA 09/2013 T score -2.3 stable from prior DEXA. Patient is going to schedule a follow up DEXA now through her work. She is also can have a vitamin D level checked through her work. She does supplement 2000 units daily. 3. Mammography 12/2014. Continue with annual mammography when due. SBE  monthly reviewed. 4. Pap smear 2016. Patient requests annual Pap smear. No history of significant abnormal Pap smears previously. 5. Colonoscopy 2016 with recommended repeat interval 5 years. 6. Health maintenance. Patient brought copies of labs from work which show a normal CBC, comprehensive metabolic panel, thyroid panel and urinalysis.  her cholesterol was 208 and her LDL was 107. Her HDL was 95. Follow up in one year, sooner as needed.   [name],Tris Howell P MD, 3:43 PM 09/26/2015

## 2015-09-27 LAB — PAP IG W/ RFLX HPV ASCU

## 2015-10-08 ENCOUNTER — Other Ambulatory Visit: Payer: Self-pay | Admitting: Gynecology

## 2015-10-08 DIAGNOSIS — Z1231 Encounter for screening mammogram for malignant neoplasm of breast: Secondary | ICD-10-CM

## 2015-10-09 ENCOUNTER — Ambulatory Visit: Payer: Self-pay | Admitting: Family Medicine

## 2015-11-11 ENCOUNTER — Encounter: Payer: Self-pay | Admitting: Family Medicine

## 2015-11-11 ENCOUNTER — Ambulatory Visit (INDEPENDENT_AMBULATORY_CARE_PROVIDER_SITE_OTHER): Payer: Managed Care, Other (non HMO) | Admitting: Family Medicine

## 2015-11-11 VITALS — BP 108/62 | HR 64 | Temp 98.2°F | Resp 12 | Wt 144.0 lb

## 2015-11-11 DIAGNOSIS — R431 Parosmia: Secondary | ICD-10-CM

## 2015-11-11 DIAGNOSIS — E785 Hyperlipidemia, unspecified: Secondary | ICD-10-CM

## 2015-11-11 DIAGNOSIS — Z780 Asymptomatic menopausal state: Secondary | ICD-10-CM | POA: Diagnosis not present

## 2015-11-11 NOTE — Progress Notes (Signed)
Subjective:  HPI  Patient is here to discuss menopause. She has seen her gynecologist in [place] for this and was diagnosed with this. No menstrual cycles. She is having hot flashes.  Prior to Admission medications   Medication Sig Start Date End Date Taking? Authorizing Provider  Ascorbic Acid (VITAMIN C PO) Take by mouth.   Yes Historical Provider, MD  Calcium Carbonate-Vitamin D (CALCIUM + D PO) Take by mouth.   Yes Historical Provider, MD  Cholecalciferol (VITAMIN D PO) Take by mouth.     Yes Historical Provider, MD  IBUPROFEN PO Take by mouth.     Yes Historical Provider, MD  Multiple Vitamin (MULTIVITAMIN) tablet Take 1 tablet by mouth daily.   Yes Historical Provider, MD    Patient Active Problem List   Diagnosis Date Noted  . Allergic rhinitis 08/19/2015  . Dermatitis, eczematoid 08/19/2015  . Acid reflux 08/19/2015  . RAD (reactive airway disease) 08/19/2015  . Endometriosis   . Endometrioma     Past Medical History:  Diagnosis Date  . Endometrioma    RIGHT OVARY  . Endometriosis   . Osteopenia 09/2013    T score -2.3 stable from prior DEXA which includes L4 at -3.1 which is greater than 1 from L3 and should have been discarded from calculation    Social History   Social History  . Marital status: Married    Spouse name: N/A  . Number of children: N/A  . Years of education: N/A   Occupational History  . Not on file.   Social History Main Topics  . Smoking status: Never Smoker  . Smokeless tobacco: Never Used  . Alcohol use No  . Drug use: No  . Sexual activity: Yes    Birth control/ protection:      Comment: 1st intercourse 50 yo-Fewer than 5 partners   Other Topics Concern  . Not on file   Social History Narrative  . No narrative on file    Allergies  Allergen Reactions  . Levaquin [Levofloxacin In D5w] Anaphylaxis    "Suicidal thoughts"    Review of Systems  Constitutional: Negative.        Hot flashes  HENT:       Abnormal  smells at times-smells smoke  Respiratory: Negative.   Cardiovascular: Negative.   Musculoskeletal: Negative.     Immunization History  Administered Date(s) Administered  . Tdap 05/14/2009   Objective:  BP 108/62   Pulse 64   Temp 98.2 F (36.8 C)   Resp 12   Wt 144 lb (65.3 kg)   LMP 04/16/2013   BMI 23.24 kg/m   Physical Exam  Constitutional: She is oriented to person, place, and time and well-developed, well-nourished, and in no distress.  HENT:  Head: Normocephalic and atraumatic.  Right Ear: External ear normal.  Left Ear: External ear normal.  Eyes: Conjunctivae are normal. Pupils are equal, round, and reactive to light.  Neck: Normal range of motion. Neck supple.  Cardiovascular: Normal rate, regular rhythm, normal heart sounds and intact distal pulses.   No murmur heard. Pulmonary/Chest: Effort normal and breath sounds normal. No respiratory distress. She has no wheezes.  Abdominal: Soft. She exhibits no distension. There is no tenderness. There is no rebound.  Musculoskeletal: Normal range of motion. She exhibits no edema or tenderness.  Neurological: She is alert and oriented to person, place, and time.  Psychiatric: Mood, memory, affect and judgment normal.    Assessment and Plan :  1. Menopause Discussed benefits vs risks of doing hormonal treatment.  2. Abnormal smell Noticed off and on "smell of smoke" Discussed possible issues this can be coming from. Offered patient referral to ENT. I do not think this a dangerous issue but for peace of mind we can refer patient at any time. At this persists may also consider neurology referral. I have also seen this symptom with depression but the patient does not have any depressive symptoms. 3. Hyperlipidemia Discussed her levels today, levels stable. 4. Chronic allergic rhinitis Patient was seen and examined by Dr. Bosie Clos and note was scribed by Samara Deist, RMA.    Julieanne Manson  MD [facility] 11/11/2015 4:32 PM

## 2015-12-30 ENCOUNTER — Ambulatory Visit: Payer: BLUE CROSS/BLUE SHIELD

## 2016-01-06 ENCOUNTER — Ambulatory Visit
Admission: RE | Admit: 2016-01-06 | Discharge: 2016-01-06 | Disposition: A | Discharge status: Home / Self Care | Payer: Managed Care, Other (non HMO) | Source: Ambulatory Visit | Attending: Gynecology | Admitting: Gynecology

## 2016-01-06 DIAGNOSIS — Z1231 Encounter for screening mammogram for malignant neoplasm of breast: Secondary | ICD-10-CM

## 2016-08-20 ENCOUNTER — Telehealth: Payer: Self-pay | Admitting: *Deleted

## 2016-08-20 NOTE — Telephone Encounter (Signed)
Pt called [facility] [phone], per note on 09/26/15 TF said okay to have dexa at job. Pt will have done and bring report with her at annual exam

## 2016-08-28 DIAGNOSIS — M810 Age-related osteoporosis without current pathological fracture: Secondary | ICD-10-CM

## 2016-08-28 HISTORY — DX: Age-related osteoporosis without current pathological fracture: M81.0

## 2016-09-03 ENCOUNTER — Telehealth: Payer: Self-pay | Admitting: Gynecology

## 2016-09-03 ENCOUNTER — Encounter: Payer: Self-pay | Admitting: Gynecology

## 2016-09-03 NOTE — Telephone Encounter (Signed)
Tell patient her most recent bone density shows osteoporosis with some decline from her prior study. Recommend office visit to discuss her case and treatment options.

## 2016-09-03 NOTE — Telephone Encounter (Signed)
Patient works [facility] saw her result on my chart printed them the [facility]inologist [facility] to review. Pt said she was so worried, the endo MD checked Vitamin D, TSH, Calcium level and told patient pt she would recommend her trying fosamax weekly. She asked if you thought this would be okay, pt said she wants you to be aware of everything that is "going on". Please advise

## 2016-09-04 NOTE — Telephone Encounter (Signed)
Pt informed

## 2016-09-04 NOTE — Telephone Encounter (Signed)
I think starting Fosamax would be fine. She'll need to arrange that through the other physician.

## 2016-09-29 ENCOUNTER — Encounter: Payer: Managed Care, Other (non HMO) | Admitting: Gynecology

## 2016-10-19 ENCOUNTER — Ambulatory Visit (INDEPENDENT_AMBULATORY_CARE_PROVIDER_SITE_OTHER): Payer: 59 | Admitting: Gynecology

## 2016-10-19 ENCOUNTER — Encounter: Payer: Self-pay | Admitting: Gynecology

## 2016-10-19 VITALS — BP 120/76 | Ht 66.0 in | Wt 143.0 lb

## 2016-10-19 DIAGNOSIS — M810 Age-related osteoporosis without current pathological fracture: Secondary | ICD-10-CM | POA: Diagnosis not present

## 2016-10-19 DIAGNOSIS — Z01419 Encounter for gynecological examination (general) (routine) without abnormal findings: Secondary | ICD-10-CM | POA: Diagnosis not present

## 2016-10-19 NOTE — Patient Instructions (Signed)
Followup in one year for annual exam, sooner if any issues 

## 2016-10-19 NOTE — Progress Notes (Signed)
    [name] 1965/07/05 [id number]        51 y.o.  G0P0 for annual exam.    Past medical history,surgical history, problem list, medications, allergies, family history and social history were all reviewed and documented as reviewed in the EPIC chart.  ROS:  Performed with pertinent positives and negatives included in the history, assessment and plan.   Additional significant findings :  None   Exam: Kennon PortelaKim Gardner assistant Vitals:   10/19/16 1454  BP: 120/76  Weight: 143 lb (64.9 kg)  Height: 5\' 6"  (1.676 m)   Body mass index is 23.08 kg/m.  General appearance:  Normal affect, orientation and appearance. Skin: Grossly normal HEENT: Without gross lesions.  No cervical or supraclavicular adenopathy. Thyroid normal.  Lungs:  Clear without wheezing, rales or rhonchi Cardiac: RR, without RMG Abdominal:  Soft, nontender, without masses, guarding, rebound, organomegaly or hernia Breasts:  Examined lying and sitting without masses, retractions, discharge or axillary adenopathy. Pelvic:  Ext, BUS, Vagina: Normal with mild atrophic changes  Cervix: Normal with mild atrophic changes. Pap smear done  Uterus: Anteverted, normal size, shape and contour, midline and mobile nontender   Adnexa: Without masses or tenderness    Anus and perineum: Normal   Rectovaginal: Normal sphincter tone without palpated masses or tenderness.    Assessment/Plan:  51 y.o. G0P0 female for annual exam.   1. Postmenopausal/mild atrophic changes. No significant hot flushes, night sweats, vaginal dryness or any vaginal bleeding. Continue to monitor report any issues or bleeding. 2. Osteoporosis. DEXA 2018 T score -3.0. Recently started on alendronate through her primary physician. Recent vitamin D 51. We'll continue to follow up with them in reference to bone health and management. 3. Pap smear 2017. Patient uncomfortable with less frequent screening recommendations. Pap smear done today. No history of  significant abnormal Pap smears previously. 4. Colonoscopy 2016. Repeat at their recommended interval. 5. Mammography coming due end of October and patient will schedule. Breast exam normal today. SBE monthly reviewed. 6. Health maintenance. Patient had routine blood work this year done through her primary physician's office. Follow up in one year, sooner as needed.  [name],[name], 3:44 PM 10/19/2016

## 2016-10-20 LAB — PAP IG W/ RFLX HPV ASCU

## 2016-12-07 ENCOUNTER — Other Ambulatory Visit: Payer: Self-pay | Admitting: Gynecology

## 2016-12-07 DIAGNOSIS — Z1231 Encounter for screening mammogram for malignant neoplasm of breast: Secondary | ICD-10-CM

## 2017-01-11 ENCOUNTER — Ambulatory Visit
Admission: RE | Admit: 2017-01-11 | Discharge: 2017-01-11 | Disposition: A | Discharge status: Home / Self Care | Payer: 59 | Source: Ambulatory Visit | Attending: Gynecology | Admitting: Gynecology

## 2017-01-11 ENCOUNTER — Other Ambulatory Visit: Payer: Self-pay | Admitting: Gynecology

## 2017-01-11 DIAGNOSIS — Z1231 Encounter for screening mammogram for malignant neoplasm of breast: Secondary | ICD-10-CM

## 2017-02-17 ENCOUNTER — Ambulatory Visit: Payer: 59 | Admitting: Family Medicine

## 2017-02-17 ENCOUNTER — Other Ambulatory Visit: Payer: Self-pay

## 2017-02-17 VITALS — BP 112/62 | HR 62 | Temp 97.8°F | Resp 14

## 2017-02-17 DIAGNOSIS — M546 Pain in thoracic spine: Secondary | ICD-10-CM

## 2017-02-17 DIAGNOSIS — M810 Age-related osteoporosis without current pathological fracture: Secondary | ICD-10-CM | POA: Diagnosis not present

## 2017-02-17 NOTE — Progress Notes (Signed)
[name]  MRN: [id number] DOB: 1965/05/24  Subjective:  HPI  Back pain: patient states she has had mid thoracis back pain for a week. She has been working on taking wall paper down at her mother's house and thinks that is why her pain is present. Not constant. Pain is described as dull and achy sensation. Pain does not radiate to other areas. Patient also has some soreness on the right side of the back/abdomen area. NO nausea or vomiting.  Patient Active Problem List   Diagnosis Date Noted  . Allergic rhinitis 08/19/2015  . Dermatitis, eczematoid 08/19/2015  . Acid reflux 08/19/2015  . RAD (reactive airway disease) 08/19/2015  . Endometriosis   . Endometrioma     Past Medical History:  Diagnosis Date  . Endometrioma    RIGHT OVARY  . Endometriosis   . Osteoporosis 08/28/2016   T score -3.0 lumbar spine, -2.1 left femoral neck    Social History   Socioeconomic History  . Marital status: Married    Spouse name: Not on file  . Number of children: Not on file  . Years of education: Not on file  . Highest education level: Not on file  Social Needs  . Financial resource strain: Not on file  . Food insecurity - worry: Not on file  . Food insecurity - inability: Not on file  . Transportation needs - medical: Not on file  . Transportation needs - non-medical: Not on file  Occupational History  . Not on file  Tobacco Use  . Smoking status: Never Smoker  . Smokeless tobacco: Never Used  Substance and Sexual Activity  . Alcohol use: No    Alcohol/week: 0.0 oz  . Drug use: No  . Sexual activity: Yes    Comment: 1st intercourse 51 yo-Fewer than 5 partners  Other Topics Concern  . Not on file  Social History Narrative  . Not on file    Outpatient Encounter Medications as of 02/17/2017  Medication Sig Note  . alendronate (FOSAMAX) 70 MG tablet Take 70 mg by mouth once a week. Take with a full glass of water on an empty stomach.   . Ascorbic Acid (VITAMIN C PO) Take  by mouth.   . Calcium Carbonate-Vitamin D (CALCIUM + D PO) Take by mouth.   . Cholecalciferol (VITAMIN D PO) Take by mouth.     . IBUPROFEN PO Take by mouth.   11/11/2015: As needed  . Multiple Vitamins-Minerals (ONE-A-DAY VITACRAVES IMMUNITY PO) Take by mouth daily. Immunity vitamin   . [DISCONTINUED] Multiple Vitamin (MULTIVITAMIN) tablet Take 1 tablet by mouth daily.    No facility-administered encounter medications on file as of 02/17/2017.     Allergies  Allergen Reactions  . Levaquin [Levofloxacin In D5w] Anaphylaxis    "Suicidal thoughts"    Review of Systems  Constitutional: Negative.   Eyes: Negative.   Respiratory: Negative.   Cardiovascular: Negative.   Gastrointestinal: Positive for abdominal pain. Diarrhea: right side of abdomen.  Musculoskeletal: Positive for back pain and myalgias.  Skin: Negative.   Neurological: Negative.   Psychiatric/Behavioral: Negative.     Objective:  BP 112/62   Pulse 62   Temp 97.8 F (36.6 C)   Resp 14   LMP 04/16/2013   Physical Exam  Constitutional: She is oriented to person, place, and time and well-developed, well-nourished, and in no distress.  HENT:  Head: Normocephalic and atraumatic.  Eyes: Conjunctivae are normal. No scleral icterus.  Neck: No thyromegaly present.  Cardiovascular: Normal rate, regular rhythm and normal heart sounds.  Pulmonary/Chest: Effort normal and breath sounds normal.  Abdominal: Soft.  Musculoskeletal: She exhibits no edema or deformity.  Muscular thoracic back normal.  Neurological: She is alert and oriented to person, place, and time.  Skin: Skin is warm and dry.  Psychiatric: Mood, memory, affect and judgment normal.    Assessment and Plan :  Thoracic Back Pain This is muscular. OTC plus heating pad. Osteoporosis  I have done the exam and reviewed the chart and it is accurate to the best of my knowledge. [employer]ist[employer]agon  technology has been used and  any errors in dictation or transcription are  unintentional. Julieanne Mansonichard Gilbert M.D. [facility]

## 2017-10-20 ENCOUNTER — Ambulatory Visit (INDEPENDENT_AMBULATORY_CARE_PROVIDER_SITE_OTHER): Payer: 59 | Admitting: Gynecology

## 2017-10-20 ENCOUNTER — Encounter: Payer: Self-pay | Admitting: Gynecology

## 2017-10-20 VITALS — BP 118/74 | Ht 66.0 in | Wt 133.0 lb

## 2017-10-20 DIAGNOSIS — Z01419 Encounter for gynecological examination (general) (routine) without abnormal findings: Secondary | ICD-10-CM | POA: Diagnosis not present

## 2017-10-20 NOTE — Progress Notes (Signed)
    [name] [birth year] [id number]        52 y.o.  G0P0 for annual gynecologic exam.  Without gynecologic complaints  Past medical history,surgical history, problem list, medications, allergies, family history and social history were all reviewed and documented as reviewed in the EPIC chart.  ROS:  Performed with pertinent positives and negatives included in the history, assessment and plan.   Additional significant findings : None   Exam: Kennon PortelaKim Gardner assistant Vitals:   10/20/17 1536  BP: 118/74  Weight: 133 lb (60.3 kg)  Height: 5\' 6"  (1.676 m)   Body mass index is 21.47 kg/m.  General appearance:  Normal affect, orientation and appearance. Skin: Grossly normal HEENT: Without gross lesions.  No cervical or supraclavicular adenopathy. Thyroid normal.  Lungs:  Clear without wheezing, rales or rhonchi Cardiac: RR, without RMG Abdominal:  Soft, nontender, without masses, guarding, rebound, organomegaly or hernia Breasts:  Examined lying and sitting without masses, retractions, discharge or axillary adenopathy. Pelvic:  Ext, BUS, Vagina: Normal with mild atrophic changes  Cervix: Normal with mild atrophic changes  Uterus: Anteverted, normal size, shape and contour, midline and mobile nontender   Adnexa: Without masses or tenderness    Anus and perineum: Normal   Rectovaginal: Normal sphincter tone without palpated masses or tenderness.    Assessment/Plan:  52 y.o. G0P0 female for annual gynecologic exam.   1. Postmenopausal.  No significant menopausal symptoms or any vaginal bleeding. 2. Osteoporosis.  DEXA 2018 T score -3.  Started on alendronate but had some joint pain.  Switch to Boniva but had GERD.  Now discussing with endocrinologist what to choose.  They had suggested Reclast.  I discussed alternatives to include Prolia and Evista.  The risks and benefits of each choice reviewed.  She is going to follow-up with them in reference to ongoing osteoporosis  management. 3. Mammography coming due in October.  Patient will follow-up for this.  Breast exam normal today. 4. Colonoscopy 2016.  Repeat at their recommended interval. 5. Pap smear 2018.  No Pap smear done today.  No history of abnormal Pap smears.  Will plan repeat Pap smear at 3-year interval per current screening guidelines. 6. Health maintenance.  Written prescription for fasting CBC, CMP, lipid profile, TSH and vitamin D given to the patient to have drawn at work.  Follow-up in 1 year, sooner as needed.   Dara Lordsimothy P Aristotelis Vilardi MD, 4:24 PM 10/20/2017

## 2017-10-20 NOTE — Patient Instructions (Signed)
Have your fasting blood work drawn.  Follow-up in 1 year for annual exam

## 2017-12-13 ENCOUNTER — Other Ambulatory Visit: Payer: Self-pay | Admitting: Gynecology

## 2017-12-13 DIAGNOSIS — Z1231 Encounter for screening mammogram for malignant neoplasm of breast: Secondary | ICD-10-CM

## 2018-01-17 ENCOUNTER — Ambulatory Visit
Admission: RE | Admit: 2018-01-17 | Discharge: 2018-01-17 | Disposition: A | Discharge status: Home / Self Care | Payer: 59 | Source: Ambulatory Visit | Attending: Gynecology | Admitting: Gynecology

## 2018-01-17 DIAGNOSIS — Z1231 Encounter for screening mammogram for malignant neoplasm of breast: Secondary | ICD-10-CM

## 2018-03-04 ENCOUNTER — Emergency Department
Admission: EM | Admit: 2018-03-04 | Discharge: 2018-03-04 | Disposition: A | Discharge status: Home / Self Care | Payer: Managed Care, Other (non HMO) | Attending: Emergency Medicine | Admitting: Emergency Medicine

## 2018-03-04 ENCOUNTER — Encounter: Payer: Self-pay | Admitting: Internal Medicine

## 2018-03-04 ENCOUNTER — Other Ambulatory Visit: Payer: Self-pay

## 2018-03-04 ENCOUNTER — Emergency Department: Payer: Managed Care, Other (non HMO)

## 2018-03-04 ENCOUNTER — Telehealth: Payer: Self-pay | Admitting: Internal Medicine

## 2018-03-04 DIAGNOSIS — I4891 Unspecified atrial fibrillation: Secondary | ICD-10-CM | POA: Insufficient documentation

## 2018-03-04 DIAGNOSIS — Z79899 Other long term (current) drug therapy: Secondary | ICD-10-CM | POA: Diagnosis not present

## 2018-03-04 DIAGNOSIS — R000 Tachycardia, unspecified: Secondary | ICD-10-CM | POA: Diagnosis present

## 2018-03-04 LAB — CBC
HEMATOCRIT: 42.9 % (ref 36.0–46.0)
Hemoglobin: 14.1 g/dL (ref 12.0–15.0)
MCH: 31.1 pg (ref 26.0–34.0)
MCHC: 32.9 g/dL (ref 30.0–36.0)
MCV: 94.5 fL (ref 80.0–100.0)
PLATELETS: 253 10*3/uL (ref 150–400)
RBC: 4.54 MIL/uL (ref 3.87–5.11)
RDW: 12.6 % (ref 11.5–15.5)
WBC: 5 10*3/uL (ref 4.0–10.5)
nRBC: 0 % (ref 0.0–0.2)

## 2018-03-04 LAB — BASIC METABOLIC PANEL
Anion gap: 11 (ref 5–15)
BUN: 14 mg/dL (ref 6–20)
CALCIUM: 9.1 mg/dL (ref 8.9–10.3)
CO2: 22 mmol/L (ref 22–32)
CREATININE: 0.65 mg/dL (ref 0.44–1.00)
Chloride: 107 mmol/L (ref 98–111)
GFR calc Af Amer: >60 mL/min (ref >60)
GFR calc non Af Amer: >60 mL/min (ref >60)
GLUCOSE: 113 mg/dL — AB (ref 70–99)
Potassium: 3.4 mmol/L — ABNORMAL LOW (ref 3.5–5.1)
Sodium: 140 mmol/L (ref 135–145)

## 2018-03-04 LAB — TROPONIN I: Troponin I: <0.03 ng/mL (ref <0.03)

## 2018-03-04 MED ORDER — DILTIAZEM HCL 25 MG/5ML IV SOLN
20.0000 mg | Freq: Once | INTRAVENOUS | Status: AC
  Administered 2018-03-04: 20 mg via INTRAVENOUS
  Filled 2018-03-04: qty 5

## 2018-03-04 MED ORDER — SODIUM CHLORIDE 0.9 % IV BOLUS
1000.0000 mL | Freq: Once | INTRAVENOUS | Status: AC
  Administered 2018-03-04: 1000 mL via INTRAVENOUS

## 2018-03-04 NOTE — ED Triage Notes (Signed)
Here from work with HR up to 200. No hx of same. Mom has a fib. No CP or SOB. Talking in complete sentences. States had loose stools today.

## 2018-03-04 NOTE — Telephone Encounter (Signed)
Patient calling Patient states she was seen in the ED for afib and that she would like to follow up with Dr Graciela HusbandsKlein Patient's mother is a patient of his  Please advise if this new patient appointment is ok or if she should see a general cardiologist

## 2018-03-04 NOTE — Telephone Encounter (Signed)
If for new patient, first available with Dr Graciela HusbandsKlein would be the end of February. Routing to [place] to review for appropriate appointment.

## 2018-03-04 NOTE — Discharge Instructions (Signed)
Please call the number provided for cardiology to arrange next available appointment

## 2018-03-04 NOTE — ED Provider Notes (Signed)
[facility] Emergency Department Provider Note  Time seen: 9:36 AM  I have reviewed the triage vital signs and the nursing notes.   HISTORY  Chief Complaint Tachycardia    HPI [name] is a 52 y.o. female with a past medical history of osteoporosis presents to the emergency department for rapid heartbeat.  According to the patient she awoke around 4:00 this morning with rapid palpitations in her chest.  Patient states this has happened before intermittently but only lasts 1 or 2 minutes and then goes away.  States she wore a Holter monitor previously but it did not capture any episodes.  States this morning since around 4:00 she has felt her heart racing and feeling like it is beating irregularly.  Denies any chest pain denies any trouble breathing.  Patient attempted to go to work but continued to feel her heart racing so she came to the emergency department for evaluation.  Denies any alcohol use.  Does state her mother has atrial fibrillation.   Past Medical History:  Diagnosis Date  . Endometrioma    RIGHT OVARY  . Endometriosis   . Osteoporosis 08/28/2016   T score -3.0 lumbar spine, -2.1 left femoral neck    Patient Active Problem List   Diagnosis Date Noted  . Allergic rhinitis 08/19/2015  . Dermatitis, eczematoid 08/19/2015  . Acid reflux 08/19/2015  . RAD (reactive airway disease) 08/19/2015  . Endometriosis   . Endometrioma     Past Surgical History:  Procedure Laterality Date  . OOPHORECTOMY  2007   LAP RSO  . PELVIC LAPAROSCOPY  2007   DX LAP, RSO , LASER VAPORIZATION OF ENDOMETRIOSIS    Prior to Admission medications   Medication Sig Start Date End Date Taking? Authorizing Provider  Ascorbic Acid (VITAMIN C PO) Take by mouth.    [provider]  Calcium Carbonate-Vitamin D (CALCIUM + D PO) Take by mouth.    [provider]  Cholecalciferol (VITAMIN D PO) Take by mouth.      [provider]   IBUPROFEN PO Take by mouth.      [provider]  Multiple Vitamins-Minerals (ONE-A-DAY VITACRAVES IMMUNITY PO) Take by mouth daily. Immunity vitamin    [provider]    Allergies  Allergen Reactions  . Levaquin [Levofloxacin In D5w] Anaphylaxis    "Suicidal thoughts"    Family History  Problem Relation Age of Onset  . Hypertension Mother   . Atrial fibrillation Mother   . Osteoporosis Mother   . Deep vein thrombosis Brother   . Heart disease Maternal Uncle   . Hypertension Maternal Grandmother   . Cancer Maternal Grandmother        BLADDER  . Osteoporosis Maternal Grandmother   . Atrial fibrillation Maternal Grandmother   . Cancer Paternal Grandmother [id number]       stomach - tumor  . Heart disease Maternal Grandfather   . Stroke Maternal Grandfather   . Heart disease Paternal Grandfather     Social History Social History   Tobacco Use  . Smoking status: Never Smoker  . Smokeless tobacco: Never Used  Substance Use Topics  . Alcohol use: No    Alcohol/week: 0.0 standard drinks  . Drug use: No    Review of Systems Constitutional: Negative for fever. Cardiovascular: Negative for chest pain.  Positive for rapid heart rate. Respiratory: Negative for shortness of breath. Gastrointestinal: Negative for abdominal pain, vomiting Genitourinary: Negative for urinary compaints Musculoskeletal: Negative for musculoskeletal  complaints Skin: Negative for skin complaints  Neurological: Negative for headache All other ROS negative  ____________________________________________   PHYSICAL EXAM:  VITAL SIGNS: ED Triage Vitals  Enc Vitals Group     BP 03/04/18 0907 (!) 136/112     Pulse Rate 03/04/18 0907 78     Resp 03/04/18 0907 20     Temp 03/04/18 0907 97.9 F (36.6 C)     Temp Source 03/04/18 0907 Oral     SpO2 03/04/18 0907 100 %     Weight --      Height --      Head Circumference --      Peak Flow --      Pain Score 03/04/18 0908 0     Pain  Loc --      Pain Edu? --      Excl. in GC? --    Constitutional: Alert and oriented.  Mildly anxious. Eyes: Normal exam ENT   Head: Normocephalic and atraumatic.   Mouth/Throat: Mucous membranes are moist. Cardiovascular: Irregular rhythm, rate around 150 bpm.  No obvious murmur. Respiratory: Normal respiratory effort without tachypnea nor retractions. Breath sounds are clear, without wheeze rales or rhonchi. Gastrointestinal: Soft and nontender. No distention. Musculoskeletal: Nontender with normal range of motion in all extremities. No lower extremity tenderness or edema. Neurologic:  Normal speech and language. No gross focal neurologic deficits Skin:  Skin is warm, dry and intact.  Psychiatric: Mood and affect are normal.   ____________________________________________    EKG  EKG viewed and interpreted by myself shows atrial fibrillation at 203 bpm with a narrow QRS, normal axis, largely normal intervals otherwise nonspecific ST changes most consistent with demand ischemia in the lateral leads.  ____________________________________________   INITIAL IMPRESSION / ASSESSMENT AND PLAN / ED COURSE  Pertinent labs & imaging results that were available during my care of the patient were reviewed by me and considered in my medical decision making (see chart for details).  Patient presents to the emergency department with palpitations found to be in what appears to be atrial fibrillation with rapid ventricular response.  Differential would include new onset A. fib with RVR, electrolyte abnormality, ACS.  Reassuringly patient has no chest pain.  We will check labs, dose diltiazem, IV fluids and continue to closely monitor.  Patient agreeable to plan of care.  Dose 10 mg of diltiazem x2.  After second dose patient appears to have converted back to a normal sinus rhythm, heart rate currently around 90 bpm.  EKG #2 9: 51: 06 reviewed and interpreted by myself shows a sinus rhythm 89  bpm with a narrow QRS, normal axis, normal intervals, no concerning ST changes noted.  Reassuringly patient's lab work has resulted within normal limits including a negative troponin.  We will monitor in the emergency department for approximately 1 hour to ensure that the patient remains in a normal sinus rhythm.  As long as the patient remains in normal sinus rhythm we will discharge with cardiology follow-up.  Patient agreeable to plan of care.  Patient has remained in normal sinus rhythm greater than 1 hour has no symptoms.  Work-up is normal.  We will discharge with cardiology follow-up.  Patient agreeable to plan of care.   CRITICAL CARE Performed by: Minna Antis   Total critical care time: 30 minutes  Critical care time was exclusive of separately billable procedures and treating other patients.  Critical care was necessary to treat or prevent imminent or life-threatening deterioration.  Critical  care was time spent personally by me on the following activities: development of treatment plan with patient and/or surrogate as well as nursing, discussions with consultants, evaluation of patient's response to treatment, examination of patient, obtaining history from patient or surrogate, ordering and performing treatments and interventions, ordering and review of laboratory studies, ordering and review of radiographic studies, pulse oximetry and re-evaluation of patient's condition.  ____________________________________________   FINAL CLINICAL IMPRESSION(S) / ED DIAGNOSES  New onset atrial fibrillation with rapid ventricular response Paroxysmal atrial fibrillation   Minna AntisPaduchowski, Ofelia Podolski, MD 03/04/18 1101

## 2018-03-07 NOTE — Telephone Encounter (Signed)
To Dr. Graciela HusbandsKlein to review. I don't see any EKG's showing a-fib. This is [name]'s daughter.   Thoughts?

## 2018-03-08 ENCOUNTER — Encounter: Payer: Self-pay | Admitting: Internal Medicine

## 2018-03-16 NOTE — Telephone Encounter (Signed)
I dont see any either,  But the ER MD describes  Lets get an echo--   Plz also get the Thromboembolic risk factors ( age -10  HTN-1  TIA/CVA-2 **, DM-1 *, Vasc disease -1 , CHF-1 , Gender-1* ) for a CHADSVASc Score of **    Also ask plz re OSA   Suggest aliveCOr and then we can figure out quickly to see her Thanks and HNY

## 2018-03-17 NOTE — Telephone Encounter (Signed)
Reviewed the patient's chart. She is already scheduled to see Dr. Graciela Husbands on 03/22/18.

## 2018-03-22 ENCOUNTER — Ambulatory Visit: Payer: Managed Care, Other (non HMO) | Admitting: Internal Medicine

## 2018-03-22 VITALS — BP 110/60 | HR 78 | Ht 67.0 in | Wt 131.0 lb

## 2018-03-22 DIAGNOSIS — I4891 Unspecified atrial fibrillation: Secondary | ICD-10-CM | POA: Diagnosis not present

## 2018-03-22 MED ORDER — DILTIAZEM HCL 30 MG PO TABS: ORAL_TABLET | ORAL | 0 refills | Status: DC

## 2018-03-22 NOTE — Patient Instructions (Signed)
Medication Instructions:  - Your physician has recommended you make the following change in your medication:   1) START dlitiazem 30 mg take 1 tablet by mouth every 6 hours as needed for fast heart rates  2) You have been given a written prescription- if you have are in a-fib for greater than 12 hours with elevated heart rates, please go to the ER for flecainide 300 mg x 1 dose  If you need a refill on your cardiac medications before your next appointment, please call your pharmacy.   Lab work: - Your physician recommends that you have lab work: [employer]  (order given to be done at [facility])   If you have labs (blood work) drawn today and your tests are completely normal, you will receive your results only by: Marland Kitchen MyChart Message (if you have MyChart) OR . A paper copy in the mail If you have any lab test that is abnormal or we need to change your treatment, we will call you to review the results.  Testing/Procedures: - none ordered  Follow-Up: At [facility], you and your health needs are our priority.  As part of our continuing mission to provide you with exceptional heart care, we have created designated Provider Care Teams.  These Care Teams include your primary Cardiologist (physician) and Advanced Practice Providers (APPs -  Physician Assistants and Nurse Practitioners) who all work together to provide you with the care you need, when you need it. . You will need a follow up appointment in 1 years with Dr. Graciela Husbands.  Please call our office 2 months in advance to schedule this appointment.    Any Other Special Instructions Will Be Listed Below (If Applicable). - N/A

## 2018-03-22 NOTE — Progress Notes (Signed)
ELECTROPHYSIOLOGY OFFICE NOTE  Patient ID: [name], MRN: [id number], DOB/AGE: 1965/06/24 53 y.o. Admit date: (Not on file) Date of Consult: 03/22/2018  Primary Physician: Maple Hudson., MD Primary Cardiologist: new     HPI [name] is a 53 y.o. female seen with atrial fibrillation having presented 03/04/2018 with tachypalpitations that awakened her from sleep.  ECG was described by the ER physician as atrial fibrillation at 203 bpm.  Converted with diltiazem.  Initial ECG is not available in epic.  Patient was able to bring up on her [facility] patient portal a copy of the ECG confirming atrial fibrillation with rapid rate complex you  Minimal symptoms, largely felt anxious and was aware of tachypalpitations. But no sob LH or CP  Been under a great deal of stress.  Things are getting better.   DATE TEST EF   3/19 Echo   55 %         Date Cr K Hgb  12/19 0.65 3.4 14.1         Thromboembolic risk factors (, Gender-1) for a CHADSVASc Score of 0   Past Medical History:  Diagnosis Date  . Endometrioma    RIGHT OVARY  . Endometriosis   . Osteoporosis 08/28/2016   T score -3.0 lumbar spine, -2.1 left femoral neck      Surgical History:  Past Surgical History:  Procedure Laterality Date  . OOPHORECTOMY  2007   LAP RSO  . PELVIC LAPAROSCOPY  2007   DX LAP, RSO , LASER VAPORIZATION OF ENDOMETRIOSIS     Home Meds: Current Meds  Medication Sig  . alendronate (FOSAMAX) 70 MG tablet Take 1 tablet by mouth once a week.  . Ascorbic Acid (VITAMIN C PO) Take by mouth.  Marland Kitchen aspirin 81 MG chewable tablet Chew 81 mg by mouth daily.  . Calcium Carbonate-Vitamin D (CALCIUM + D PO) Take by mouth.  . Cholecalciferol (VITAMIN D PO) Take by mouth.    . IBUPROFEN PO Take by mouth.    . Multiple Vitamins-Minerals (ONE-A-DAY VITACRAVES IMMUNITY PO) Take by mouth daily. Immunity vitamin    Allergies:  Allergies  Allergen Reactions  . Levaquin [Levofloxacin In  D5w] Anaphylaxis    "Suicidal thoughts"    Social History   Socioeconomic History  . Marital status: Married    Spouse name: Not on file  . Number of children: Not on file  . Years of education: Not on file  . Highest education level: Not on file  Occupational History  . Not on file  Social Needs  . Financial resource strain: Not on file  . Food insecurity:    Worry: Not on file    Inability: Not on file  . Transportation needs:    Medical: Not on file    Non-medical: Not on file  Tobacco Use  . Smoking status: Never Smoker  . Smokeless tobacco: Never Used  Substance and Sexual Activity  . Alcohol use: No    Alcohol/week: 0.0 standard drinks  . Drug use: No  . Sexual activity: Yes    Comment: 1st intercourse 53 yo-Fewer than 5 partners  Lifestyle  . Physical activity:    Days per week: Not on file    Minutes per session: Not on file  . Stress: Not on file  Relationships  . Social connections:    Talks on phone: Not on file    Gets together: Not on file    Attends religious  service: Not on file    Active member of club or organization: Not on file    Attends meetings of clubs or organizations: Not on file    Relationship status: Not on file  . Intimate partner violence:    Fear of current or ex partner: Not on file    Emotionally abused: Not on file    Physically abused: Not on file    Forced sexual activity: Not on file  Other Topics Concern  . Not on file  Social History Narrative  . Not on file     Family History  Problem Relation Age of Onset  . Hypertension Mother   . Atrial fibrillation Mother   . Osteoporosis Mother   . Deep vein thrombosis Brother   . Heart disease Maternal Uncle   . Hypertension Maternal Grandmother   . Cancer Maternal Grandmother        BLADDER  . Osteoporosis Maternal Grandmother   . Atrial fibrillation Maternal Grandmother   . Cancer Paternal Grandmother [id number]       stomach - tumor  . Heart disease Maternal Grandfather   .  Stroke Maternal Grandfather   . Heart disease Paternal Grandfather      ROS:  Please see the history of present illness.   Thank you perfect all other systems reviewed and negative.    Physical Exam: Blood pressure 110/60, pulse 78, height 5\' 7"  (1.702 m), weight 131 lb (59.4 kg), last menstrual period 04/16/2013. General: Well developed, well nourished female in no acute distress. Head: Normocephalic, atraumatic, sclera non-icteric, no xanthomas, nares are without discharge. EENT: normal  Lymph Nodes:  none Neck: Negative for carotid bruits. JVD not elevated. Back:without scoliosis kyphosis Lungs: Clear bilaterally to auscultation without wheezes, rales, or rhonchi. Breathing is unlabored. Heart: RRR with S1 S2. No  murmur . No rubs, or gallops appreciated. Abdomen: Soft, non-tender, non-distended with normoactive bowel sounds. No hepatomegaly. No rebound/guarding. No obvious abdominal masses. Msk:  Strength and tone appear normal for age. Extremities: No clubbing or cyanosis. No edema.  Distal pedal pulses are 2+ and equal bilaterally. Skin: Warm and Dry Neuro: Alert and oriented X 3. CN III-XII intact Grossly normal sensory and motor function . Psych:  Responds to questions appropriately with a normal affect.intrermittently tearful      Labs: Cardiac Enzymes No results for input(s): CKTOTAL, CKMB, TROPONINI in the last 72 hours. CBC Lab Results  Component Value Date   WBC 5.0 03/04/2018   HGB 14.1 03/04/2018   HCT 42.9 03/04/2018   MCV 94.5 03/04/2018   PLT 253 03/04/2018   PROTIME: No results for input(s): LABPROT, INR in the last 72 hours. Chemistry No results for input(s): NA, K, CL, CO2, BUN, CREATININE, CALCIUM, PROT, BILITOT, ALKPHOS, ALT, AST, GLUCOSE in the last 168 hours.  Invalid input(s): LABALBU Lipids Lab Results  Component Value Date   CHOL 209 (A) 08/19/2015   HDL 96 (A) 08/19/2015   LDLCALC 102 08/19/2015   TRIG 56 08/19/2015   BNP No results  found for: PROBNP Thyroid Function Tests: No results for input(s): TSH, T4TOTAL, T3FREE, THYROIDAB in the last 72 hours.  Invalid input(s): FREET3 Miscellaneous No results found for: DDIMER  Radiology/Studies:  No results found.  EKG: Sinus at 78 Intervals 14/07/37  ECG from 12/20 demonstrates atrial fibrillation at 205   Assessment and Plan:  Atrial fibrillation-paroxysmal  Hypokalemia  Stress  The patient has had an isolated episode of atrial fibrillation.  The likelihood of recurrence is  probably higher than some given no clear triggers although her stress may have been contributing.  In any case, her CHADS-VASc score is 0  Not sure how the hypokalemia was related; we will recheck her metabolic profile.  Encouraged her to work on her stress  We will give her a prescription for diltiazem 30 mg to take every 6 as needed for recurrences.  She is advised to go to the emergency room if she wakes up with atrial fibrillation of more than 12 hours duration.  She is also been given instructions to be given flecainide 300 mg on arrival to the ER.  If she does not convert over the next 6 hours, she should be cardioverted.     Sherryl MangesSteven Klein

## 2018-04-21 ENCOUNTER — Ambulatory Visit: Payer: Managed Care, Other (non HMO) | Admitting: Internal Medicine

## 2018-04-21 ENCOUNTER — Encounter

## 2018-10-21 ENCOUNTER — Other Ambulatory Visit: Payer: Self-pay

## 2018-10-24 ENCOUNTER — Ambulatory Visit (INDEPENDENT_AMBULATORY_CARE_PROVIDER_SITE_OTHER): Payer: Managed Care, Other (non HMO) | Admitting: Gynecology

## 2018-10-24 ENCOUNTER — Other Ambulatory Visit: Payer: Self-pay

## 2018-10-24 ENCOUNTER — Encounter: Payer: Self-pay | Admitting: Gynecology

## 2018-10-24 VITALS — BP 112/70 | Ht 66.0 in | Wt 137.0 lb

## 2018-10-24 DIAGNOSIS — N952 Postmenopausal atrophic vaginitis: Secondary | ICD-10-CM

## 2018-10-24 DIAGNOSIS — M810 Age-related osteoporosis without current pathological fracture: Secondary | ICD-10-CM | POA: Diagnosis not present

## 2018-10-24 DIAGNOSIS — Z01419 Encounter for gynecological examination (general) (routine) without abnormal findings: Secondary | ICD-10-CM | POA: Diagnosis not present

## 2018-10-24 NOTE — Addendum Note (Signed)
Addended by: Nelva Nay on: 10/24/2018 04:15 PM   Modules accepted: Orders

## 2018-10-24 NOTE — Patient Instructions (Signed)
Follow-up for annual exam in 1 year, sooner if any issues 

## 2018-10-24 NOTE — Progress Notes (Signed)
    [name] 04-Nov-1965 [id number]        53 y.o.  G0P0 for annual gynecologic exam.  Doing well from a gynecologic standpoint.  Had episode of A. fib earlier this year which spontaneously resolved.  Past medical history,surgical history, problem list, medications, allergies, family history and social history were all reviewed and documented as reviewed in the EPIC chart.  ROS:  Performed with pertinent positives and negatives included in the history, assessment and plan.   Additional significant findings : None   Exam: Caryn Bee assistant Vitals:   10/24/18 1526  BP: 112/70  Weight: 137 lb (62.1 kg)  Height: 5\' 6"  (1.676 m)   Body mass index is 22.11 kg/m.  General appearance:  Normal affect, orientation and appearance. Skin: Grossly normal HEENT: Without gross lesions.  No cervical or supraclavicular adenopathy. Thyroid normal.  Lungs:  Clear without wheezing, rales or rhonchi Cardiac: RR, without RMG Abdominal:  Soft, nontender, without masses, guarding, rebound, organomegaly or hernia Breasts:  Examined lying and sitting without masses, retractions, discharge or axillary adenopathy. Pelvic:  Ext, BUS, Vagina: Normal with mild atrophic changes  Cervix: Normal with mild atrophic changes  Uterus: Anteverted, normal size, shape and contour, midline and mobile nontender   Adnexa: Without masses or tenderness    Anus and perineum: Normal   Rectovaginal: Normal sphincter tone without palpated masses or tenderness.    Assessment/Plan:  53 y.o. G0P0 female for annual gynecologic exam.   1. Postmenopausal.  No significant menopausal symptoms or any vaginal bleeding. 2. Osteoporosis.  DEXA 2018 T score -3.  Currently on alendronate.  Followed elsewhere. 3. Mammography 01/2018.  Continue with annual mammography end of this year.  Breast exam normal today. 4. Colonoscopy 2016.  Planned repeat next year at 5-year interval per patient history. 5. Pap smear 2018.  Pap smear  done today at patient's request.  No history of abnormal Pap smears previously. 6. Health maintenance.  Written orders given for CBC, CMP, lipid profile, TSH and vitamin D drawn at her work.  Follow-up 1 year, sooner as needed.   Anastasio Auerbach MD, 4:02 PM 10/24/2018

## 2018-10-26 LAB — PAP IG W/ RFLX HPV ASCU

## 2018-12-07 ENCOUNTER — Encounter: Payer: Self-pay | Admitting: Gynecology

## 2019-01-27 ENCOUNTER — Other Ambulatory Visit: Payer: Self-pay | Admitting: Gynecology

## 2019-01-27 DIAGNOSIS — Z1231 Encounter for screening mammogram for malignant neoplasm of breast: Secondary | ICD-10-CM

## 2019-03-23 ENCOUNTER — Ambulatory Visit
Admission: RE | Admit: 2019-03-23 | Discharge: 2019-03-23 | Disposition: A | Discharge status: Home / Self Care | Payer: Managed Care, Other (non HMO) | Source: Ambulatory Visit | Attending: Gynecology | Admitting: Gynecology

## 2019-03-23 ENCOUNTER — Other Ambulatory Visit: Payer: Self-pay

## 2019-03-23 DIAGNOSIS — Z1231 Encounter for screening mammogram for malignant neoplasm of breast: Secondary | ICD-10-CM

## 2019-06-01 ENCOUNTER — Other Ambulatory Visit: Payer: Self-pay

## 2019-06-01 ENCOUNTER — Encounter: Payer: Self-pay | Admitting: Internal Medicine

## 2019-06-01 ENCOUNTER — Ambulatory Visit: Payer: Managed Care, Other (non HMO) | Admitting: Internal Medicine

## 2019-06-01 VITALS — BP 110/70 | HR 69 | Ht 67.0 in | Wt 141.1 lb

## 2019-06-01 DIAGNOSIS — I480 Paroxysmal atrial fibrillation: Secondary | ICD-10-CM

## 2019-06-01 NOTE — Progress Notes (Signed)
ELECTROPHYSIOLOGY OFFICE NOTE  Patient ID: [name], MRN: [id number], DOB/AGE: [birth year] 54 y.o. Admit date: (Not on file) Date of Consult: 06/01/2019  Primary Physician: Jerrol Banana., MD Primary Cardiologist: new     HPI [name] is a 54 y.o. female seen in follow-up for a trial fibrillation having presented 03/04/2018 with tachypalpitations that awakened her from sleep.  ECG was described by the ER physician as atrial fibrillation at 203 bpm.  Converted with diltiazem.  Initial ECG is not available in epic.  Patient was able to bring up on her [facility] patient portal a copy of the ECG confirming atrial fibrillation with rapid rate     Less stressed.  Not very active.  Lipids reviewed from 11/20.  Predictor less than 1%.  DATE TEST EF   3/19 Echo   55 %         Date Cr K Hgb  12/19 0.65 3.4 14.1   11/20 0.7 4.7 86.7   Thromboembolic risk factors (, Gender-1) for a CHADSVASc Score of 0   Past Medical History:  Diagnosis Date  . AF (atrial fibrillation) (HCC)    Single episode  . Endometrioma    RIGHT OVARY  . Endometriosis   . Osteoporosis 08/28/2016   T score -3.0 lumbar spine, -2.1 left femoral neck      Surgical History:  Past Surgical History:  Procedure Laterality Date  . OOPHORECTOMY  2007   LAP RSO  . PELVIC LAPAROSCOPY  2007   DX LAP, RSO , LASER VAPORIZATION OF ENDOMETRIOSIS     Home Meds: Current Meds  Medication Sig  . Ascorbic Acid (VITAMIN C PO) Take by mouth.  . Calcium Carbonate-Vitamin D (CALCIUM + D PO) Take by mouth.  . Cholecalciferol (VITAMIN D PO) Take by mouth.    . IBUPROFEN PO Take by mouth.    Marland Kitchen MAGNESIUM PO Take by mouth daily.  . Multiple Vitamins-Minerals (ONE-A-DAY VITACRAVES IMMUNITY PO) Take by mouth daily. Immunity vitamin    Allergies:  Allergies  Allergen Reactions  . Levaquin [Levofloxacin In D5w] Anaphylaxis    "Suicidal thoughts"    Social History   Socioeconomic History  .  Marital status: Married    Spouse name: Not on file  . Number of children: Not on file  . Years of education: Not on file  . Highest education level: Not on file  Occupational History  . Not on file  Tobacco Use  . Smoking status: Never Smoker  . Smokeless tobacco: Never Used  Substance and Sexual Activity  . Alcohol use: No    Alcohol/week: 0.0 standard drinks  . Drug use: No  . Sexual activity: Yes    Birth control/protection: Post-menopausal    Comment: 1st intercourse 54 yo-Fewer than 5 partners  Other Topics Concern  . Not on file  Social History Narrative  . Not on file   Social Determinants of Health   Financial Resource Strain:   . Difficulty of Paying Living Expenses:   Food Insecurity:   . Worried About Charity fundraiser in the Last Year:   . Arboriculturist in the Last Year:   Transportation Needs:   . Film/video editor (Medical):   Marland Kitchen Lack of Transportation (Non-Medical):   Physical Activity:   . Days of Exercise per Week:   . Minutes of Exercise per Session:   Stress:   . Feeling of Stress :   Social Connections:   .  Frequency of Communication with Friends and Family:   . Frequency of Social Gatherings with Friends and Family:   . Attends Religious Services:   . Active Member of Clubs or Organizations:   . Attends Banker Meetings:   Marland Kitchen Marital Status:   Intimate Partner Violence:   . Fear of Current or Ex-Partner:   . Emotionally Abused:   Marland Kitchen Physically Abused:   . Sexually Abused:      Family History  Problem Relation Age of Onset  . Hypertension Mother   . Atrial fibrillation Mother   . Osteoporosis Mother   . Deep vein thrombosis Brother   . Heart disease Maternal Uncle   . Hypertension Maternal Grandmother   . Cancer Maternal Grandmother        BLADDER  . Osteoporosis Maternal Grandmother   . Atrial fibrillation Maternal Grandmother   . Cancer Paternal Grandmother 70       stomach - tumor  . Heart disease Maternal  Grandfather   . Stroke Maternal Grandfather   . Heart disease Paternal Grandfather      ROS:  Please see the history of present illness.   Thank you perfect all other systems reviewed and negative.    BP 110/70 (BP Location: Left Arm, Patient Position: Sitting, Cuff Size: Normal)   Pulse 69   Ht 5\' 7"  (1.702 m)   Wt 141 lb 2 oz (64 kg)   LMP 04/16/2013   SpO2 98%   BMI 22.10 kg/m  Well developed and nourished in no acute distress HENT normal Neck supple    Clear Regular rate and rhythm, no murmurs or gallops Abd-soft with active BS No Clubbing cyanosis edema Skin-warm and dry A & Oriented  Grossly normal sensory and motor function  ECG 69 \ 15/07/38     Assessment and Plan:  Atrial fibrillation-paroxysmal  Hypokalemia  Stress  No interval afib  The lipid calculator/ACC risk predictor is less than 1%.  Stress is better.  No interval hypokalemia    17/07/38

## 2019-06-01 NOTE — Patient Instructions (Signed)

## 2019-10-20 LAB — HM COLONOSCOPY

## 2019-10-25 ENCOUNTER — Other Ambulatory Visit: Payer: Self-pay

## 2019-10-25 ENCOUNTER — Encounter: Payer: Managed Care, Other (non HMO) | Admitting: Gynecology

## 2019-10-25 ENCOUNTER — Encounter: Payer: Self-pay | Admitting: Obstetrics and Gynecology

## 2019-10-25 ENCOUNTER — Ambulatory Visit (INDEPENDENT_AMBULATORY_CARE_PROVIDER_SITE_OTHER): Payer: Managed Care, Other (non HMO) | Admitting: Obstetrics and Gynecology

## 2019-10-25 VITALS — BP 118/80 | Ht 66.0 in | Wt 144.0 lb

## 2019-10-25 DIAGNOSIS — M810 Age-related osteoporosis without current pathological fracture: Secondary | ICD-10-CM | POA: Diagnosis not present

## 2019-10-25 DIAGNOSIS — Z01419 Encounter for gynecological examination (general) (routine) without abnormal findings: Secondary | ICD-10-CM

## 2019-10-25 DIAGNOSIS — Z124 Encounter for screening for malignant neoplasm of cervix: Secondary | ICD-10-CM

## 2019-10-25 NOTE — Progress Notes (Signed)
   Nadalee Neiswender 07-01-1965 [id number]  SUBJECTIVE:  54 y.o. G0P0 female for annual routine gynecologic exam and Pap smear. She has no gynecologic concerns.  Current Outpatient Medications  Medication Sig Dispense Refill  . Ascorbic Acid (VITAMIN C PO) Take by mouth.    . Calcium Carbonate-Vitamin D (CALCIUM + D PO) Take by mouth.    . Cholecalciferol (VITAMIN D PO) Take by mouth.      . IBUPROFEN PO Take by mouth.      Marland Kitchen MAGNESIUM PO Take by mouth daily.    . Multiple Vitamins-Minerals (ONE-A-DAY VITACRAVES IMMUNITY PO) Take by mouth daily. Immunity vitamin    . diltiazem (CARDIZEM) 30 MG tablet Take 1 tablet (30 mg) by mouth every 6 hours as needed for fast heart rates (Patient not taking: Reported on 06/01/2019) 30 tablet 0   No current facility-administered medications for this visit.   Allergies: Levaquin [levofloxacin in d5w]  Patient's last menstrual period was 04/16/2013.  Past medical history,surgical history, problem list, medications, allergies, family history and social history were all reviewed and documented as reviewed in the EPIC chart.  ROS:  Feeling well. No dyspnea or chest pain on exertion.  No abdominal pain, change in bowel habits, black or bloody stools.  No urinary tract symptoms. GYN ROS: no abnormal bleeding, pelvic pain or discharge, no breast pain or new or enlarging lumps on self exam.No neurological complaints.  OBJECTIVE:  BP 118/80   Ht 5\' 6"  (1.676 m)   Wt 144 lb (65.3 kg)   LMP 04/16/2013   BMI 23.24 kg/m  The patient appears well, alert, oriented x 3, in no distress. ENT normal.  Neck supple. No cervical or supraclavicular adenopathy or thyromegaly.  Lungs are clear, good air entry, no wheezes, rhonchi or rales. S1 and S2 normal, no murmurs, regular rate and rhythm.  Abdomen soft without tenderness, guarding, mass or organomegaly.  Neurological is normal, no focal findings.  BREAST EXAM: breasts appear normal, no suspicious masses, no skin or  nipple changes or axillary nodes  PELVIC EXAM: VULVA: normal appearing vulva with no masses, tenderness or lesions, atrophic change, VAGINA: normal appearing vagina with normal color and discharge, no lesions, CERVIX: normal appearing cervix without discharge or lesions, UTERUS: uterus is normal size, shape, consistency and nontender, ADNEXA: normal adnexa in size, nontender and no masses, PAP: Pap smear done today, thin-prep method  Chaperone: 06/14/2013 present during the examination  ASSESSMENT:  54 y.o. G0P0 here for annual gynecologic exam  PLAN:   1. Postmenopausal.  No significant menopausal symptoms, manages mild hot flashes.  No vaginal bleeding. 2. Pap smear 2018. No significant history of abnormal Pap smears.  Pap smear is collected today. 3. Mammogram 03/2019.  Normal breast exam today.  She is reminded to schedule an annual mammogram this year when due. 4. Colonoscopy 2021.  Recommended that she follow up at the recommended interval.   5. DEXA 08/2019.  T score -2.8, FRAX 13% / 1.7%.  Interval increase in BMD noted.  (result in care everywhere).  Maintained on alendronate and followed elsewhere. 6. Health maintenance.  [facility] baseline labs, written order given for CBC, CMP, lipid profile, TSH, vitamin D level to be drawn at her workplace.    Return annually or sooner, prn.  09/2019 MD 10/25/19

## 2019-10-27 LAB — PAP IG W/ RFLX HPV ASCU

## 2019-11-07 NOTE — Telephone Encounter (Signed)
Spoke with patient and informed her. Appt scheduled. 

## 2019-11-10 ENCOUNTER — Encounter: Payer: Self-pay | Admitting: *Deleted

## 2019-11-29 ENCOUNTER — Ambulatory Visit: Payer: Managed Care, Other (non HMO) | Admitting: Obstetrics and Gynecology

## 2019-12-12 ENCOUNTER — Ambulatory Visit: Payer: Managed Care, Other (non HMO) | Admitting: Obstetrics and Gynecology

## 2019-12-26 ENCOUNTER — Other Ambulatory Visit: Payer: Self-pay

## 2019-12-26 ENCOUNTER — Ambulatory Visit (INDEPENDENT_AMBULATORY_CARE_PROVIDER_SITE_OTHER): Payer: Managed Care, Other (non HMO) | Admitting: Obstetrics and Gynecology

## 2019-12-26 ENCOUNTER — Encounter: Payer: Self-pay | Admitting: Obstetrics and Gynecology

## 2019-12-26 VITALS — BP 118/74

## 2019-12-26 DIAGNOSIS — Z124 Encounter for screening for malignant neoplasm of cervix: Secondary | ICD-10-CM

## 2019-12-26 DIAGNOSIS — R87615 Unsatisfactory cytologic smear of cervix: Secondary | ICD-10-CM

## 2019-12-26 NOTE — Addendum Note (Signed)
Addended by: Dayna Barker on: 12/26/2019 03:25 PM   Modules accepted: Orders

## 2019-12-26 NOTE — Progress Notes (Signed)
   [name] [birth year] [id number]  SUBJECTIVE:  54 y.o. G0P0 female presents for repeat Pap smear.  At her annual routine exam annual 10/25/2019 the Pap smear specimen returned with insufficient cells.  OBJECTIVE:  BP 118/74   LMP 04/16/2013  PELVIC EXAM: VULVA: normal appearing vulva with no masses, tenderness or lesions, atrophic change, VAGINA: normal appearing vagina with normal color and discharge, no lesions, CERVIX: normal appearing cervix without discharge or lesions  Chaperone: Kennon Portela present during the examination  ASSESSMENT:  54 y.o. G0P0 here for repeat Pap smear examination, continuation from her routine annual exam  PLAN:  Pap smear specimen collected.  Will not charge E&M for today's visit.   Theresia Majors MD 12/26/19

## 2019-12-27 LAB — PAP IG W/ RFLX HPV ASCU

## 2020-09-01 ENCOUNTER — Other Ambulatory Visit: Payer: Self-pay | Admitting: Physician Assistant

## 2020-09-01 ENCOUNTER — Other Ambulatory Visit: Payer: Self-pay

## 2020-09-01 ENCOUNTER — Telehealth: Payer: Self-pay | Admitting: Physician Assistant

## 2020-09-01 ENCOUNTER — Encounter: Payer: Self-pay | Admitting: Emergency Medicine

## 2020-09-01 ENCOUNTER — Emergency Department
Admission: EM | Admit: 2020-09-01 | Discharge: 2020-09-01 | Disposition: A | Discharge status: Home / Self Care | Payer: Managed Care, Other (non HMO) | Attending: Emergency Medicine | Admitting: Emergency Medicine

## 2020-09-01 DIAGNOSIS — I480 Paroxysmal atrial fibrillation: Secondary | ICD-10-CM

## 2020-09-01 DIAGNOSIS — R002 Palpitations: Secondary | ICD-10-CM | POA: Diagnosis present

## 2020-09-01 LAB — CBC
HCT: 42.2 % (ref 36.0–46.0)
Hemoglobin: 14.4 g/dL (ref 12.0–15.0)
MCH: 31.2 pg (ref 26.0–34.0)
MCHC: 34.1 g/dL (ref 30.0–36.0)
MCV: 91.5 fL (ref 80.0–100.0)
Platelets: 262 10*3/uL (ref 150–400)
RBC: 4.61 MIL/uL (ref 3.87–5.11)
RDW: 12.7 % (ref 11.5–15.5)
WBC: 5 10*3/uL (ref 4.0–10.5)
nRBC: 0 % (ref 0.0–0.2)

## 2020-09-01 LAB — BASIC METABOLIC PANEL
Anion gap: 6 (ref 5–15)
BUN: 12 mg/dL (ref 6–20)
CO2: 25 mmol/L (ref 22–32)
Calcium: 9.1 mg/dL (ref 8.9–10.3)
Chloride: 108 mmol/L (ref 98–111)
Creatinine, Ser: 0.68 mg/dL (ref 0.44–1.00)
GFR, Estimated: >60 mL/min (ref >60)
Glucose, Bld: 118 mg/dL — ABNORMAL HIGH (ref 70–99)
Potassium: 3.4 mmol/L — ABNORMAL LOW (ref 3.5–5.1)
Sodium: 139 mmol/L (ref 135–145)

## 2020-09-01 LAB — TROPONIN I (HIGH SENSITIVITY): Troponin I (High Sensitivity): 15 ng/L (ref <18)

## 2020-09-01 LAB — MAGNESIUM: Magnesium: 2 mg/dL (ref 1.7–2.4)

## 2020-09-01 MED ORDER — DILTIAZEM HCL 30 MG PO TABS: ORAL_TABLET | ORAL | 0 refills | Status: DC

## 2020-09-01 MED ORDER — POTASSIUM CHLORIDE CRYS ER 20 MEQ PO TBCR
40.0000 meq | EXTENDED_RELEASE_TABLET | Freq: Once | ORAL | Status: AC
  Administered 2020-09-01: 40 meq via ORAL
  Filled 2020-09-01: qty 2

## 2020-09-01 NOTE — ED Provider Notes (Signed)
[facility] Emergency Department Provider Note   ____________________________________________   Event Date/Time   First MD Initiated Contact with Patient 09/01/20 1342     (approximate)  I have reviewed the triage vital signs and the nursing notes.   HISTORY  Chief Complaint Irregular Heart Beat    HPI [name] is a 55 y.o. female with past medical history of atrial fibrillation and endometriosis who presents to the ED complaining of palpitations.  Patient reports that she woke up around 3:45 AM to find that her heart felt like it was racing.  She denies any associated chest pain or shortness of breath, she had been feeling well previously with no fevers, cough, vomiting, diarrhea, or abdominal pain.  She does state that she has been under stress recently and has not been eating as well as she usually does.  She spoke with the PA at her cardiology office, who recommended she take 30 mg of diltiazem that she was previously prescribed.  She had minimal improvement in her heart rate and took a second dose of diltiazem around 9 AM.  She continued to sense palpitations and so came to the ED for further evaluation.        Past Medical History:  Diagnosis Date   AF (atrial fibrillation) (HCC)    Single episode   Endometrioma    RIGHT OVARY   Endometriosis    Osteoporosis 08/28/2016   T score -3.0 lumbar spine, -2.1 left femoral neck    Patient Active Problem List   Diagnosis Date Noted   Allergic rhinitis 08/19/2015   Dermatitis, eczematoid 08/19/2015   Acid reflux 08/19/2015   RAD (reactive airway disease) 08/19/2015   Endometriosis    Endometrioma     Past Surgical History:  Procedure Laterality Date   OOPHORECTOMY  2007   LAP RSO   PELVIC LAPAROSCOPY  2007   DX LAP, RSO , LASER VAPORIZATION OF ENDOMETRIOSIS    Prior to Admission medications   Medication Sig Start Date End Date Taking? Authorizing Provider  Ascorbic Acid (VITAMIN C  PO) Take by mouth.    [provider]  Calcium Carbonate-Vitamin D (CALCIUM + D PO) Take by mouth.    [provider]  Cholecalciferol (VITAMIN D PO) Take by mouth.      [provider]  diltiazem (CARDIZEM) 30 MG tablet Take 1 tablet (30 mg) by mouth every 6 hours as needed for fast heart rates 09/01/20   Azalee Course, PA  IBUPROFEN PO Take by mouth.      [provider]  MAGNESIUM PO Take by mouth daily.    [provider]  Multiple Vitamins-Minerals (ONE-A-DAY VITACRAVES IMMUNITY PO) Take by mouth daily. Immunity vitamin    [provider]    Allergies Levaquin [levofloxacin in d5w]  Family History  Problem Relation Age of Onset   Hypertension Mother    Atrial fibrillation Mother    Osteoporosis Mother    Deep vein thrombosis Brother    Heart disease Maternal Uncle    Hypertension Maternal Grandmother    Cancer Maternal Grandmother        BLADDER   Osteoporosis Maternal Grandmother    Atrial fibrillation Maternal Grandmother    Cancer Paternal Grandmother 79       stomach - tumor   Heart disease Maternal Grandfather    Stroke Maternal Grandfather    Heart disease Paternal Grandfather     Social History Social History   Tobacco Use  Smoking status: Never   Smokeless tobacco: Never  Vaping Use   Vaping Use: Never used  Substance Use Topics   Alcohol use: No    Alcohol/week: 0.0 standard drinks   Drug use: No    Review of Systems  Constitutional: No fever/chills Eyes: No visual changes. ENT: No sore throat. Cardiovascular: Denies chest pain.  Positive for palpitations. Respiratory: Denies shortness of breath. Gastrointestinal: No abdominal pain.  No nausea, no vomiting.  No diarrhea.  No constipation. Genitourinary: Negative for dysuria. Musculoskeletal: Negative for back pain. Skin: Negative for rash. Neurological: Negative for headaches, focal weakness or  numbness.  ____________________________________________   PHYSICAL EXAM:  VITAL SIGNS: ED Triage Vitals  Enc Vitals Group     BP 09/01/20 1235 126/90     Pulse Rate 09/01/20 1235 75     Resp 09/01/20 1235 18     Temp 09/01/20 1235 98.1 F (36.7 C)     Temp Source 09/01/20 1235 Oral     SpO2 09/01/20 1235 100 %     Weight 09/01/20 1236 143 lb 4.8 oz (65 kg)     Height 09/01/20 1236 5\' 6"  (1.676 m)     Head Circumference --      Peak Flow --      Pain Score 09/01/20 1234 0     Pain Loc --      Pain Edu? --      Excl. in GC? --     Constitutional: Alert and oriented. Eyes: Conjunctivae are normal. Head: Atraumatic. Nose: No congestion/rhinnorhea. Mouth/Throat: Mucous membranes are moist. Neck: Normal ROM Cardiovascular: Tachycardic, irregularly irregular rhythm. Grossly normal heart sounds.  2+ radial pulses bilaterally. Respiratory: Normal respiratory effort.  No retractions. Lungs CTAB. Gastrointestinal: Soft and nontender. No distention. Genitourinary: deferred Musculoskeletal: No lower extremity tenderness nor edema. Neurologic:  Normal speech and language. No gross focal neurologic deficits are appreciated. Skin:  Skin is warm, dry and intact. No rash noted. Psychiatric: Mood and affect are normal. Speech and behavior are normal.  ____________________________________________   LABS (all labs ordered are listed, but only abnormal results are displayed)  Labs Reviewed  BASIC METABOLIC PANEL - Abnormal; Notable for the following components:      Result Value   Potassium 3.4 (*)    Glucose, Bld 118 (*)    All other components within normal limits  CBC  MAGNESIUM  TROPONIN I (HIGH SENSITIVITY)   ____________________________________________  EKG  ED ECG REPORT I, 09/03/20, the attending physician, personally viewed and interpreted this ECG.   Date: 09/01/2020  EKG Time: 12:30  Rate: 150  Rhythm: Atrial flutter with 2:1 block  Axis: Normal   Intervals:none  ST&T Change: None  ED ECG REPORT I, 09/03/2020, the attending physician, personally viewed and interpreted this ECG.   Date: 09/01/2020  EKG Time: 13:53  Rate: 74  Rhythm: normal sinus rhythm  Axis: Normal  Intervals:none  ST&T Change: None    PROCEDURES  Procedure(s) performed (including Critical Care):  Procedures   ____________________________________________   INITIAL IMPRESSION / ASSESSMENT AND PLAN / ED COURSE      55 year old female with past medical history of atrial fibrillation and endometriosis who presents to the ED complaining of palpitations starting around 345 this morning.  She took 2 doses of 30 mg diltiazem per her cardiology PA but continued to feel palpitations and so was advised to seek care in the ED.  Initial EKG shows atrial fibrillation with RVR versus atrial flutter with 2-1  block, however after patient arrived to a room she seems to have converted to normal sinus rhythm.  Follow-up EKG shows normal sinus rhythm with no ischemic changes, labs remarkable only for mild hypokalemia.  We will replete potassium and case discussed with Dr. Mariah Milling of cardiology, who recommends holding off on regular rate control medication given patient's low BP at baseline.  She was prescribed additional diltiazem to take as needed by PA Meng.  She was counseled to return to the ED for new worsening symptoms, patient agrees with plan.      ____________________________________________   FINAL CLINICAL IMPRESSION(S) / ED DIAGNOSES  Final diagnoses:  Paroxysmal atrial fibrillation [facility])     ED Discharge Orders     None        Note:  This document was prepared using Dragon voice recognition software and may include unintentional dictation errors.    Chesley Noon, MD 09/01/20 1455

## 2020-09-01 NOTE — Telephone Encounter (Signed)
Mrs. [name] is a 55 year old female with past medical history of atrial fibrillation in December 2019 however no recurrence since then.  Due to her low CHA2DS2-Vasc score (1 for female), she was not placed on anticoagulation therapy.  She has good cardiac rhythm awareness of atrial fibrillation.  Patient was given 30 mg of diltiazem to take on as-needed basis.  This morning, around 3:45 AM, she noted her heart rate increased to 1 that the 130s.  Initial blood pressure was in the low 100 range.  She took a dose of her over the diltiazem.  Patient contacted after our answering service, by which time heart rate is still elevated at 118.  I instructed her to take a second dose of short acting diltiazem.  I spoke her case with our electrophysiologist Dr. Hurman Horn who is also her cardiologist as well.  If she is still in atrial fibrillation in 2 hours, she has been instructed to go to the local ED.  Given the short duration of atrial fibrillation, appropriate treatment will be 300 mg of flecainide for single dose to chemically cardiovert her.  I have sent in a new prescription of diltiazem to her local pharmacy.  I will also reach out to our A. fib clinic to arrange a follow-up for her.  Per Dr. Graciela Husbands, if she is able to tolerate chemical cardioversion via flecainide without any issue in the emergency room, in the future, we can prescribe her some flecainide she can take at home on an as-needed basis if she does have any recurrence of atrial fibrillation.  I will forward to our staff to send referral to afib clinic and contact her the appt.

## 2020-09-01 NOTE — ED Triage Notes (Signed)
Pt reports has been in A-fib all morning. Pt attempted to convert self with meds with no relief. Pt was advised by MD to come to the ED. MD in room with RN while triaging

## 2020-09-01 NOTE — ED Notes (Signed)
Pt states she took 30mg  cardizem at home d/t having 1 prior A rib episode in 2019. States cardiologist prescribed her cardizem in 2020 as needed but has never has to use it. States after taking 30mg  she took a second dose a little bit later d/t heart rate still being elevated. States she had called cardiologist office that she had previously seen and they told her to do that. Heart rate was still elevated so came to ER per cards request. Pt states this is only 2nd time she's been in a fib, can feel it. States has been stressed lately and not eating. States last time she was in a fib it was d/t her potassium being low. Pt is A&O, speech clear, able to move all extremities.

## 2020-09-02 ENCOUNTER — Other Ambulatory Visit: Payer: Self-pay

## 2020-09-02 NOTE — Addendum Note (Signed)
Addended by: Dorris Fetch on: 09/02/2020 08:48 AM   Modules accepted: Orders

## 2020-09-02 NOTE — Telephone Encounter (Signed)
Placed order for referral to Afib clinic

## 2020-09-10 ENCOUNTER — Other Ambulatory Visit: Payer: Self-pay

## 2020-09-10 ENCOUNTER — Encounter: Payer: Self-pay | Admitting: Internal Medicine

## 2020-09-10 ENCOUNTER — Ambulatory Visit: Payer: Managed Care, Other (non HMO) | Admitting: Internal Medicine

## 2020-09-10 VITALS — BP 108/78 | HR 67 | Ht 66.0 in | Wt 144.0 lb

## 2020-09-10 DIAGNOSIS — I4892 Unspecified atrial flutter: Secondary | ICD-10-CM

## 2020-09-10 NOTE — Progress Notes (Signed)
Patient ID: [name], female   DOB: [birth year], 55 y.o.   MRN: [id number]      ELECTROPHYSIOLOGY OFFICE NOTE  Patient ID: [name], MRN: [id number], DOB/AGE: 09-19-1965 55 y.o. Admit date: (Not on file) Date of Consult: 09/10/2020  Primary Physician: Maple Hudson., MD Primary Cardiologist: new     HPI [name] is a 55 y.o. female seen in follow-up for atrial fibrillation having presented 03/04/2018 with tachypalpitations that awakened her from sleep.  ECG was described by the ER physician as atrial fibrillation at 203 bpm.  Converted with diltiazem.  Initial ECG is not available in epic.  Patient was able to bring up on her [facility] patient portal a copy of the ECG confirming atrial fibrillation with rapid rate   Interval presentation to the emergency room 6/22 with tachypalpitations and atrial flutter with 2: 1 conduction   Lipids reviewed from 11/20.  Predictor less than 1%.    Today,the patient denies chest pain, shortness of breath, nocturnal dyspnea, orthopnea or peripheral edema.  There have been no lightheadedness or syncope.  Complains of palpitations that awakened her at night.  They were unassociated with shortness of breath or chest pain.  She have been stressed and feeling fatigue; concurrent with this for some dietary changes.  Temporally associated with her husband going through evaluation for what is turned out to be prostate cancer already  She notes she have mild palpitations when she have a menopause flare   She will start her exercise regimen   [name] her Husband has prostate cancer   DATE TEST EF   3/19 Echo   55 %         Date Cr K Hgb  12/19 0.65 3.4 14.1   11/20 0.7 4.7 13.1  6/22 0.68 3.4 14.4   Thromboembolic risk factors (, Gender-1) for a CHADSVASc Score of 1  Past Medical History:  Diagnosis Date   AF (atrial fibrillation) (HCC)    Single episode   Endometrioma    RIGHT OVARY   Endometriosis    Osteoporosis  08/28/2016   T score -3.0 lumbar spine, -2.1 left femoral neck      Surgical History:  Past Surgical History:  Procedure Laterality Date   OOPHORECTOMY  2007   LAP RSO   PELVIC LAPAROSCOPY  2007   DX LAP, RSO , LASER VAPORIZATION OF ENDOMETRIOSIS     Home Meds: No outpatient medications have been marked as taking for the 09/10/20 encounter (Appointment) with Duke Salvia, MD.    Allergies:  Allergies  Allergen Reactions   Levaquin [Levofloxacin In D5w] Anaphylaxis    "Suicidal thoughts"          Physical Exam: BP 108/78 (BP Location: Left Arm, Patient Position: Sitting, Cuff Size: Normal)   Pulse 67   Ht 5\' 6"  (1.676 m)   Wt 144 lb (65.3 kg)   LMP 04/16/2013   SpO2 99%   BMI 23.24 kg/m  Well developed and well nourished in no acute distress HENT normal Neck supple with JVP-flat Lungs Clear Device pocket well healed; without hematoma or erythema. There is no tethering  Regular rate and rhythm, No gallop and No murmur Abd-soft with active BS No Clubbing cyanosis No edema Skin-warm and dry A & Oriented  Grossly normal sensory and motor function  ECG: sinus 67 15/08/41    Ass and hadExtraction ofessment and Plan:  Atrial fibrillation/flutter-paroxysmal  Hypokalemia  Stress   Interval atrial flutter temporally associated  with hypokalemia as well as dietary restrictions.  Spontaneously terminated after number of hours.  It begs the issue as to whether the initial rhythm was in fact fibrillation or rather flutter.  This would have an impact on therapeutic options; however, it is the patient's impression that a relates to the dietary issues and hopefully will not have recurrent events.  We discussed that flutter is a substrate issue and ectopy will be what triggers it.  At this point however, given her CHA2DS2-VASc score there is no benefit to doing anything given the paucity of symptoms and the infrequency of the events    I,[name],acting as a  scribe for Sherryl Manges, MD.,have documented all relevant documentation on the behalf of Sherryl Manges, MD,as directed by  Sherryl Manges, MD while in the presence of Sherryl Manges, MD.  .I, Sherryl Manges, MD, have reviewed all documentation for this visit. The documentation on 09/10/20 for the exam, diagnosis, procedures, and orders are all accurate and complete.

## 2020-09-10 NOTE — Patient Instructions (Signed)
Medication Instructions:  Your physician recommends that you continue on your current medications as directed. Please refer to the Current Medication list given to you today.  *If you need a refill on your cardiac medications before your next appointment, please call your pharmacy*   Lab Work: None ordered  If you have labs (blood work) drawn today and your tests are completely normal, you will receive your results only by: MyChart Message (if you have MyChart) OR A paper copy in the mail If you have any lab test that is abnormal or we need to change your treatment, we will call you to review the results.   Testing/Procedures: None ordered   Follow-Up: At [facility], you and your health needs are our priority.  As part of our continuing mission to provide you with exceptional heart care, we have created designated Provider Care Teams.  These Care Teams include your primary Cardiologist (physician) and Advanced Practice Providers (APPs -  Physician Assistants and Nurse Practitioners) who all work together to provide you with the care you need, when you need it.  We recommend signing up for the patient portal called "MyChart".  Sign up information is provided on this After Visit Summary.  MyChart is used to connect with patients for Virtual Visits (Telemedicine).  Patients are able to view lab/test results, encounter notes, upcoming appointments, etc.  Non-urgent messages can be sent to your provider as well.   To learn more about what you can do with MyChart, go to [email or web address].    Your next appointment:   As needed  The format for your next appointment:   In Person  Provider:   Steven Klein, MD   Other Instructions N/A  

## 2020-09-11 ENCOUNTER — Ambulatory Visit (HOSPITAL_COMMUNITY): Payer: Managed Care, Other (non HMO) | Admitting: Physician Assistant

## 2020-10-25 ENCOUNTER — Encounter: Payer: Managed Care, Other (non HMO) | Admitting: Obstetrics and Gynecology

## 2020-10-29 ENCOUNTER — Ambulatory Visit: Payer: Managed Care, Other (non HMO) | Admitting: Nurse Practitioner

## 2020-11-26 ENCOUNTER — Ambulatory Visit (INDEPENDENT_AMBULATORY_CARE_PROVIDER_SITE_OTHER): Payer: Managed Care, Other (non HMO) | Admitting: Nurse Practitioner

## 2020-11-26 ENCOUNTER — Other Ambulatory Visit: Payer: Self-pay

## 2020-11-26 ENCOUNTER — Other Ambulatory Visit (HOSPITAL_COMMUNITY)
Admission: RE | Admit: 2020-11-26 | Discharge: 2020-11-26 | Disposition: A | Discharge status: Home / Self Care | Payer: Managed Care, Other (non HMO) | Source: Ambulatory Visit | Attending: Obstetrics and Gynecology | Admitting: Obstetrics and Gynecology

## 2020-11-26 ENCOUNTER — Encounter: Payer: Self-pay | Admitting: Nurse Practitioner

## 2020-11-26 ENCOUNTER — Other Ambulatory Visit: Payer: Self-pay | Admitting: Family Medicine

## 2020-11-26 VITALS — BP 118/76 | Ht 65.0 in | Wt 140.0 lb

## 2020-11-26 DIAGNOSIS — Z1231 Encounter for screening mammogram for malignant neoplasm of breast: Secondary | ICD-10-CM

## 2020-11-26 DIAGNOSIS — Z01419 Encounter for gynecological examination (general) (routine) without abnormal findings: Secondary | ICD-10-CM

## 2020-11-26 DIAGNOSIS — M810 Age-related osteoporosis without current pathological fracture: Secondary | ICD-10-CM | POA: Diagnosis not present

## 2020-11-26 DIAGNOSIS — Z780 Asymptomatic menopausal state: Secondary | ICD-10-CM

## 2020-11-26 NOTE — Progress Notes (Signed)
   [name] 1966-03-03 [id number]   History:  55 y.o. G0 presents for annual exam. Postmenopausal - no HRT, no bleeding. 2007 RSO for endometriosis. Normal pap and mammogram history. On Fosamax since 10/2016 for osteoporosis, managed by endocrinology. Cardiology managing A fib, thought to be caused by hypokalemia.   Gynecologic History Patient's last menstrual period was 04/16/2013.   Contraception/Family planning: post menopausal status Sexually active: Yes  Health Maintenance Last Pap: 12/26/2019. Results were: Normal Last mammogram: 03/23/2019. Results were: Normal Last colonoscopy: 10/20/2019. Results were: Tubular adenoma, 5-year recall Last Dexa: 09/07/2019. Results were: T-score -2.8  Past medical history, past surgical history, family history and social history were all reviewed and documented in the EPIC chart. 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   ROS:  A ROS was performed and pertinent positives and negatives are included.  Exam:  Vitals:   11/26/20 1529  BP: 118/76  Weight: 140 lb (63.5 kg)  Height: 5\' 5"  (1.651 m)   Body mass index is 23.3 kg/m.  General appearance:  Normal Thyroid:  Symmetrical, normal in size, without palpable masses or nodularity. Respiratory  Auscultation:  Clear without wheezing or rhonchi Cardiovascular  Auscultation:  Regular rate, without rubs, murmurs or gallops  Edema/varicosities:  Not grossly evident Abdominal  Soft,nontender, without masses, guarding or rebound.  Liver/spleen:  No organomegaly noted  Hernia:  None appreciated  Skin  Inspection:  Grossly normal Breasts: Examined lying and sitting.   Right: Without masses, retractions, nipple discharge or axillary adenopathy.   Left: Without masses, retractions, nipple discharge or axillary adenopathy. Genitourinary   Inguinal/mons:  Normal without inguinal adenopathy  External genitalia:  Normal appearing vulva with no masses, tenderness, or lesions  BUS/Urethra/Skene's  glands:  Normal  Vagina:  Normal appearing with normal color and discharge, no lesions  Cervix:  Normal appearing without discharge or lesions  Uterus:  Normal in size, shape and contour.  Midline and mobile, nontender  Adnexa/parametria:     Rt: Normal in size, without masses or tenderness.   Lt: Normal in size, without masses or tenderness.  Anus and perineum: Normal  Digital rectal exam: Normal sphincter tone without palpated masses or tenderness  Patient informed chaperone available to be present for breast and pelvic exam. Patient has requested no chaperone to be present. Patient has been advised what will be completed during breast and pelvic exam.   Assessment/Plan:  55 y.o. G0 for annual exam.   Well female exam with routine gynecological exam - Plan: Cytology - PAP( [facility]). Education provided on SBEs, importance of preventative screenings, current guidelines, high calcium diet, regular exercise, and multivitamin daily.  Labs done at her work.   Postmenopausal - no HRT, no bleeding.   Age-related osteoporosis without current pathological fracture - On Fosamax since 2018, managed by endocrinology. Most recent T-score -3.0 in June 2021.   Screening for cervical cancer - Normal Pap history. Prefers annual pap smears. Pap with reflex collected today.   Screening for breast cancer - Normal mammogram history.  Continue annual screenings. She is overdue and plans to schedule this soon. Normal breast exam today.  Screening for colon cancer - 2021 colonoscopy. Will repeat at GI's recommended interval.   Return in 1 year for annual.   2022 DNP, 4:01 PM 11/26/2020

## 2020-11-29 LAB — CYTOLOGY - PAP: Diagnosis: NEGATIVE

## 2020-12-11 ENCOUNTER — Ambulatory Visit: Payer: Self-pay | Admitting: Family Medicine

## 2021-01-07 ENCOUNTER — Other Ambulatory Visit: Payer: Self-pay

## 2021-01-07 ENCOUNTER — Ambulatory Visit
Admission: RE | Admit: 2021-01-07 | Discharge: 2021-01-07 | Disposition: A | Discharge status: Home / Self Care | Payer: Managed Care, Other (non HMO) | Source: Ambulatory Visit | Attending: Family Medicine | Admitting: Family Medicine

## 2021-01-07 DIAGNOSIS — Z1231 Encounter for screening mammogram for malignant neoplasm of breast: Secondary | ICD-10-CM

## 2021-01-10 ENCOUNTER — Other Ambulatory Visit: Payer: Self-pay | Admitting: Family Medicine

## 2021-01-10 DIAGNOSIS — R928 Other abnormal and inconclusive findings on diagnostic imaging of breast: Secondary | ICD-10-CM

## 2021-01-16 ENCOUNTER — Ambulatory Visit (INDEPENDENT_AMBULATORY_CARE_PROVIDER_SITE_OTHER): Payer: Managed Care, Other (non HMO) | Admitting: Family Medicine

## 2021-01-16 ENCOUNTER — Other Ambulatory Visit: Payer: Self-pay

## 2021-01-16 ENCOUNTER — Encounter: Payer: Self-pay | Admitting: Family Medicine

## 2021-01-16 ENCOUNTER — Telehealth: Payer: Self-pay

## 2021-01-16 VITALS — BP 107/71 | HR 73 | Temp 98.4°F | Resp 16 | Ht 65.0 in | Wt 134.0 lb

## 2021-01-16 DIAGNOSIS — M810 Age-related osteoporosis without current pathological fracture: Secondary | ICD-10-CM

## 2021-01-16 DIAGNOSIS — J309 Allergic rhinitis, unspecified: Secondary | ICD-10-CM

## 2021-01-16 DIAGNOSIS — K219 Gastro-esophageal reflux disease without esophagitis: Secondary | ICD-10-CM | POA: Diagnosis not present

## 2021-01-16 DIAGNOSIS — L308 Other specified dermatitis: Secondary | ICD-10-CM | POA: Diagnosis not present

## 2021-01-16 DIAGNOSIS — R928 Other abnormal and inconclusive findings on diagnostic imaging of breast: Secondary | ICD-10-CM

## 2021-01-16 DIAGNOSIS — I480 Paroxysmal atrial fibrillation: Secondary | ICD-10-CM

## 2021-01-16 NOTE — Progress Notes (Signed)
I,[name],acting as a scribe for Kimberly Mans, MD.,have documented all relevant documentation on the behalf of Kimberly Mans, MD,as directed by  Kimberly Mans, MD while in the presence of Kimberly Mans, MD.   Established patient visit   Patient: [name]   DOB: 03-26-1965   55 y.o. Female  MRN: [id number] Visit Date: 01/16/2021  Today's healthcare provider: Megan Mans, MD   Chief Complaint  Patient presents with   Follow-up    Subjective    HPI   Patient is here following up on chronic problems.  Overall things are well.  She is married and has no children.  Her husband been dealing with prostate cancer this past year and this has been a major stressor.  Her mother is starting to be in failing health some.  Mother is elderly but otherwise active. The main issue recently as patient has a mammogram for which she is waiting on follow-up imaging and possible biopsy for abnormality.  She is obviously worried about this.  She has found no discernible lump.  Physically she feels well.    Medications: Outpatient Medications Prior to Visit  Medication Sig   alendronate (FOSAMAX) 70 MG tablet Take 70 mg by mouth once a week.   Ascorbic Acid (VITAMIN C PO) Take by mouth.   Calcium Carbonate-Vitamin D (CALCIUM + D PO) Take by mouth.   Cholecalciferol (VITAMIN D PO) Take by mouth.     IBUPROFEN PO Take by mouth.     MAGNESIUM PO Take by mouth daily.   Multiple Vitamins-Minerals (ONE-A-DAY VITACRAVES IMMUNITY PO) Take by mouth daily. Immunity vitamin   NON FORMULARY Immunity Support   diltiazem (CARDIZEM) 30 MG tablet Take 1 tablet (30 mg) by mouth every 6 hours as needed for fast heart rates (Patient not taking: Reported on 01/16/2021)   No facility-administered medications prior to visit.    Review of Systems      Objective    BP 107/71 (BP Location: Right Arm, Patient Position: Sitting, Cuff Size: Normal)   Pulse 73   Temp 98.4 F (36.9  C) (Oral)   Resp 16   Ht 5\' 5"  (1.651 m)   Wt 134 lb (60.8 kg)   LMP 04/16/2013   SpO2 99%   BMI 22.30 kg/m  Wt Readings from Last 3 Encounters:  01/16/21 134 lb (60.8 kg)  11/26/20 140 lb (63.5 kg)  09/10/20 144 lb (65.3 kg)      Physical Exam Vitals reviewed.  Constitutional:      General: She is not in acute distress.    Appearance: She is well-developed.  HENT:     Head: Normocephalic and atraumatic.     Right Ear: Hearing normal.     Left Ear: Hearing normal.     Nose: Nose normal.  Eyes:     General: Lids are normal. No scleral icterus.       Right eye: No discharge.        Left eye: No discharge.     Conjunctiva/sclera: Conjunctivae normal.  Cardiovascular:     Rate and Rhythm: Normal rate and regular rhythm.     Heart sounds: Normal heart sounds.  Pulmonary:     Effort: Pulmonary effort is normal. No respiratory distress.     Breath sounds: Normal breath sounds.  Skin:    General: Skin is warm and dry.     Findings: No lesion or rash.  Neurological:  General: No focal deficit present.     Mental Status: She is alert and oriented to person, place, and time.  Psychiatric:        Mood and Affect: Mood normal.        Speech: Speech normal.        Behavior: Behavior normal.        Thought Content: Thought content normal.        Judgment: Judgment normal.      No results found for any visits on 01/16/21.  Assessment & Plan     1. Allergic rhinitis, unspecified seasonality, unspecified trigger   2. Gastroesophageal reflux disease without esophagitis Stable presently  3. Other eczema Lifelong issue.   4. Age-related osteoporosis without current pathological fracture On alendronate  5. Paroxysmal atrial fibrillation (HCC) As needed diltiazem followed by cardiology  6. Abnormal mammogram Undergoing follow-up mammogram and work-up   No follow-ups on file.      I, Kimberly Mans, MD, have reviewed all documentation for this visit. The  documentation on 01/24/21 for the exam, diagnosis, procedures, and orders are all accurate and complete.    Kimberly Dulworth Wendelyn Breslow, MD  [facility] [phone] (phone) [phone] (fax)  [facility]

## 2021-01-16 NOTE — Telephone Encounter (Signed)
Copied from CRM [id number]. Topic: General - Other >> Jan 15, 2021  4:35 PM Laural Benes, [place] C wrote: Reason for CRM: pt called in for assistance. Pt says that she received a call from imaging stating that her mammogram needs further viewing. Pt was told to reach out to her provider to have another order placed. Pt also has an apt tomorrow with PCP.

## 2021-01-16 NOTE — Telephone Encounter (Signed)
It looks like the order has already been placed.    Thanks,   -Vernona Rieger

## 2021-02-01 ENCOUNTER — Ambulatory Visit
Admission: RE | Admit: 2021-02-01 | Discharge: 2021-02-01 | Disposition: A | Discharge status: Home / Self Care | Payer: Managed Care, Other (non HMO) | Source: Ambulatory Visit | Attending: Family Medicine | Admitting: Family Medicine

## 2021-02-01 ENCOUNTER — Other Ambulatory Visit: Payer: Self-pay

## 2021-02-01 DIAGNOSIS — R928 Other abnormal and inconclusive findings on diagnostic imaging of breast: Secondary | ICD-10-CM

## 2021-02-04 ENCOUNTER — Other Ambulatory Visit: Payer: Managed Care, Other (non HMO)

## 2021-12-05 NOTE — Progress Notes (Unsigned)
      Established patient visit  I,[name],acting as a scribe for Kimberly Durie, MD.,have documented all relevant documentation on the behalf of Kimberly Durie, MD,as directed by  Kimberly Durie, MD while in the presence of Kimberly Durie, MD.   Patient: [name]   DOB: 09-May-1965   56 y.o. Female  MRN: [id number] Visit Date: 12/08/2021  Today's healthcare provider: Wilhemena Durie, MD   No chief complaint on file.  Subjective    HPI  Patient is here for follow up on chronic issues.  Medications: Outpatient Medications Prior to Visit  Medication Sig   alendronate (FOSAMAX) 70 MG tablet Take 70 mg by mouth once a week.   Ascorbic Acid (VITAMIN C PO) Take by mouth.   Calcium Carbonate-Vitamin D (CALCIUM + D PO) Take by mouth.   Cholecalciferol (VITAMIN D PO) Take by mouth.     diltiazem (CARDIZEM) 30 MG tablet Take 1 tablet (30 mg) by mouth every 6 hours as needed for fast heart rates (Patient not taking: Reported on 01/16/2021)   IBUPROFEN PO Take by mouth.     MAGNESIUM PO Take by mouth daily.   Multiple Vitamins-Minerals (ONE-A-DAY VITACRAVES IMMUNITY PO) Take by mouth daily. Immunity vitamin   NON FORMULARY Immunity Support   No facility-administered medications prior to visit.    Review of Systems  Constitutional:  Negative for appetite change, chills, fatigue and fever.  Respiratory:  Negative for chest tightness and shortness of breath.   Cardiovascular:  Negative for chest pain and palpitations.  Gastrointestinal:  Negative for abdominal pain, nausea and vomiting.  Neurological:  Negative for dizziness and weakness.    {Labs  Heme  Chem  Endocrine  Serology  Results Review (optional):23779}   Objective    LMP 04/16/2013  {Show previous vital signs (optional):23777}  Physical Exam  ***  No results found for any visits on 12/08/21.  Assessment & Plan     1. Prediabetes  - CBC w/Diff/Platelet - Hemoglobin A1c -  Magnesium - TSH - Comprehensive Metabolic Panel (CMET) - Lipid panel  2. Fatigue, unspecified type  - CBC w/Diff/Platelet - Hemoglobin A1c - Magnesium - TSH - Comprehensive Metabolic Panel (CMET) - Lipid panel  3. Hypokalemia  - CBC w/Diff/Platelet - Hemoglobin A1c - Magnesium - TSH - Comprehensive Metabolic Panel (CMET) - Lipid panel  4. Atrial fibrillation, unspecified type (HCC)  - CBC w/Diff/Platelet - Hemoglobin A1c - Magnesium - TSH - Comprehensive Metabolic Panel (CMET) - Lipid panel  5. Post-COVID syndrome  - CBC w/Diff/Platelet - Hemoglobin A1c - Magnesium - TSH - Comprehensive Metabolic Panel (CMET) - Lipid panel   No follow-ups on file.      {provider attestation***:1}   Kimberly Durie, MD  [facility] [phone] (phone) [phone] (fax)  [place]

## 2021-12-08 ENCOUNTER — Ambulatory Visit (INDEPENDENT_AMBULATORY_CARE_PROVIDER_SITE_OTHER): Payer: Managed Care, Other (non HMO) | Admitting: Family Medicine

## 2021-12-08 ENCOUNTER — Encounter: Payer: Self-pay | Admitting: Family Medicine

## 2021-12-08 VITALS — BP 108/73 | HR 95 | Resp 16 | Wt 142.0 lb

## 2021-12-08 DIAGNOSIS — I4891 Unspecified atrial fibrillation: Secondary | ICD-10-CM

## 2021-12-08 DIAGNOSIS — R5383 Other fatigue: Secondary | ICD-10-CM

## 2021-12-08 DIAGNOSIS — U099 Post covid-19 condition, unspecified: Secondary | ICD-10-CM

## 2021-12-08 DIAGNOSIS — E876 Hypokalemia: Secondary | ICD-10-CM

## 2021-12-08 DIAGNOSIS — R7303 Prediabetes: Secondary | ICD-10-CM | POA: Diagnosis not present

## 2021-12-09 ENCOUNTER — Ambulatory Visit: Payer: Managed Care, Other (non HMO) | Admitting: Nurse Practitioner

## 2021-12-23 ENCOUNTER — Encounter: Payer: Self-pay | Admitting: Nurse Practitioner

## 2021-12-23 ENCOUNTER — Other Ambulatory Visit (HOSPITAL_COMMUNITY)
Admission: RE | Admit: 2021-12-23 | Discharge: 2021-12-23 | Disposition: A | Discharge status: Home / Self Care | Payer: Managed Care, Other (non HMO) | Source: Ambulatory Visit | Attending: Nurse Practitioner | Admitting: Nurse Practitioner

## 2021-12-23 ENCOUNTER — Ambulatory Visit (INDEPENDENT_AMBULATORY_CARE_PROVIDER_SITE_OTHER): Payer: Managed Care, Other (non HMO) | Admitting: Nurse Practitioner

## 2021-12-23 VITALS — BP 102/68 | HR 82 | Ht 65.5 in | Wt 139.0 lb

## 2021-12-23 DIAGNOSIS — Z01419 Encounter for gynecological examination (general) (routine) without abnormal findings: Secondary | ICD-10-CM | POA: Diagnosis present

## 2021-12-23 DIAGNOSIS — Z780 Asymptomatic menopausal state: Secondary | ICD-10-CM

## 2021-12-23 DIAGNOSIS — R7309 Other abnormal glucose: Secondary | ICD-10-CM | POA: Diagnosis not present

## 2021-12-23 DIAGNOSIS — M810 Age-related osteoporosis without current pathological fracture: Secondary | ICD-10-CM | POA: Diagnosis not present

## 2021-12-23 NOTE — Progress Notes (Signed)
ANAJA MONTS 23-Mar-1965 [id number]   History:  56 y.o. G0 presents for annual exam. Postmenopausal - no HRT, no bleeding. S/P 2007 RSO for endometriosis. Normal pap history. On Fosamax since 10/2016 for osteoporosis, managed by endocrinology. Cardiology managing A fib. A1c 5.8 in June, wore a Dexcom x 10 days and had all normal blood sugars. Would like rechecked today.   Gynecologic History Patient's last menstrual period was 04/16/2013.   Contraception/Family planning: post menopausal status Sexually active: Yes  Health Maintenance Last Pap: 11/26/2020. Results were: Normal Last mammogram: 01/07/2021. Results were: Possible left breast mass, benign cyst on ultrasound Last colonoscopy: 10/20/2019. Results were: Tubular adenoma, 5-year recall Last Dexa: 09/08/2021. Results were: T-score -2.8  Past medical history, past surgical history, family history and social history were all reviewed and documented in the EPIC chart. Married. 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   ROS:  A ROS was performed and pertinent positives and negatives are included.  Exam:  Vitals:   12/23/21 1549  BP: 102/68  Pulse: 82  SpO2: 99%  Weight: 139 lb (63 kg)  Height: 5' 5.5" (1.664 m)    Body mass index is 22.78 kg/m.  General appearance:  Normal Thyroid:  Symmetrical, normal in size, without palpable masses or nodularity. Respiratory  Auscultation:  Clear without wheezing or rhonchi Cardiovascular  Auscultation:  Regular rate, without rubs, murmurs or gallops  Edema/varicosities:  Not grossly evident Abdominal  Soft,nontender, without masses, guarding or rebound.  Liver/spleen:  No organomegaly noted  Hernia:  None appreciated  Skin  Inspection:  Grossly normal Breasts: Examined lying and sitting.   Right: Without masses, retractions, nipple discharge or axillary adenopathy.   Left: Without masses, retractions, nipple discharge or axillary adenopathy. Genitourinary   Inguinal/mons:  Normal without  inguinal adenopathy  External genitalia:  Normal appearing vulva with no masses, tenderness, or lesions  BUS/Urethra/Skene's glands:  Normal  Vagina:  Normal appearing with normal color and discharge, no lesions. Atrophic changes  Cervix:  Normal appearing without discharge or lesions  Uterus:  Normal in size, shape and contour.  Midline and mobile, nontender  Adnexa/parametria:     Rt: Normal in size, without masses or tenderness.   Lt: Normal in size, without masses or tenderness.  Anus and perineum: Normal  Digital rectal exam: Normal sphincter tone without palpated masses or tenderness  Patient informed chaperone available to be present for breast and pelvic exam. Patient has requested no chaperone to be present. Patient has been advised what will be completed during breast and pelvic exam.   Assessment/Plan:  56 y.o. G0 for annual exam.   Well female exam with routine gynecological exam - Plan: Cytology - PAP( [place]). Education provided on SBEs, importance of preventative screenings, current guidelines, high calcium diet, regular exercise, and multivitamin daily.  Labs done at her work. Labs with PCP.  Elevated hemoglobin A1c - Plan: Hemoglobin A1c. A1c 5.8 in June, wore a Dexcom x 10 days and had all normal blood sugars. Would like rechecked today.   Postmenopausal - no HRT, no bleeding.   Age-related osteoporosis without current pathological fracture - On Fosamax since 2018, managed by endocrinology. Most recent T-score -2.8 in June.   Screening for cervical cancer - Normal Pap history. Discussed current guidelines and 3-year recommendation. Prefers annual paps.  Screening for breast cancer - Benign left breast cyst seen on most recent screening. Continue annual screenings. Normal breast exam today.  Screening for colon cancer - 2021 colonoscopy. Will repeat at  GI's recommended interval.   Return in 1 year for annual.     [place], 4:16 PM 12/23/2021

## 2021-12-24 LAB — HEMOGLOBIN A1C
Hgb A1c MFr Bld: 5.8 % of total Hgb — ABNORMAL HIGH (ref <5.7)
Mean Plasma Glucose: 120 mg/dL
eAG (mmol/L): 6.6 mmol/L

## 2021-12-24 LAB — CYTOLOGY - PAP: Diagnosis: NEGATIVE

## 2022-01-22 ENCOUNTER — Other Ambulatory Visit: Payer: Self-pay | Admitting: Nurse Practitioner

## 2022-01-22 DIAGNOSIS — Z1231 Encounter for screening mammogram for malignant neoplasm of breast: Secondary | ICD-10-CM

## 2022-02-18 ENCOUNTER — Ambulatory Visit: Payer: Managed Care, Other (non HMO)

## 2022-03-12 ENCOUNTER — Encounter: Payer: Self-pay | Admitting: Internal Medicine

## 2022-03-12 ENCOUNTER — Ambulatory Visit: Payer: Managed Care, Other (non HMO) | Attending: Internal Medicine | Admitting: Internal Medicine

## 2022-03-12 VITALS — BP 100/60 | HR 67 | Ht 67.0 in | Wt 142.1 lb

## 2022-03-12 DIAGNOSIS — I480 Paroxysmal atrial fibrillation: Secondary | ICD-10-CM | POA: Diagnosis not present

## 2022-03-12 DIAGNOSIS — I4892 Unspecified atrial flutter: Secondary | ICD-10-CM | POA: Diagnosis not present

## 2022-03-12 NOTE — Patient Instructions (Signed)
Medication Instructions:  - Your physician recommends that you continue on your current medications as directed. Please refer to the Current Medication list given to you today.  *If you need a refill on your cardiac medications before your next appointment, please call your pharmacy*   Lab Work: - Your physician recommends that you have lab work at your convenience  CBC  (Lab slip given today)   If you have labs (blood work) drawn today and your tests are completely normal, you will receive your results only by: MyChart Message (if you have MyChart) OR A paper copy in the mail If you have any lab test that is abnormal or we need to change your treatment, we will call you to review the results.   Testing/Procedures: - none ordered   Follow-Up: At [facility], you and your health needs are our priority.  As part of our continuing mission to provide you with exceptional heart care, we have created designated Provider Care Teams.  These Care Teams include your primary Cardiologist (physician) and Advanced Practice Providers (APPs -  Physician Assistants and Nurse Practitioners) who all work together to provide you with the care you need, when you need it.  We recommend signing up for the patient portal called "MyChart".  Sign up information is provided on this After Visit Summary.  MyChart is used to connect with patients for Virtual Visits (Telemedicine).  Patients are able to view lab/test results, encounter notes, upcoming appointments, etc.  Non-urgent messages can be sent to your provider as well.   To learn more about what you can do with MyChart, go to [email or web address].    Your next appointment:   1 year(s)  The format for your next appointment:   In Person  Provider:   Sherryl Manges, MD    Other Instructions N/a  Important Information About Sugar

## 2022-03-12 NOTE — Progress Notes (Signed)
ELECTROPHYSIOLOGY OFFICE NOTE  Patient ID: [name], MRN: [id number], DOB/AGE: Dec 23, 1965 56 y.o. Admit date: (Not on file) Date of Consult: 03/12/2022  Primary Physician: Jerrol Banana., MD Primary Cardiologist: new     HPI [name] is a 56 y.o. female seen in follow-up for atrial fibrillation having presented 03/04/2018 with tachypalpitations that awakened her from sleep.  ECG was described by the ER physician as atrial fibrillation at 203 bpm.  Converted with diltiazem.  Initial ECG is not available in epic.  Patient was able to bring up on her [facility] patient portal a copy of the ECG confirming atrial fibrillation with rapid rate (now on epic and confirms atrial fibrillation not flutter)  Presented 6/22 with tachypalpitations.  ECG consistent with atrial flutter 2: 1 conduction with spontaneous reversion  The patient denies chest pain, shortness of breat, nocturnal dyspnea, orthopnea or peripheral edema.  There have been no palpitations, lightheadedness or syncope.  Complains of some leg pain wearing compression socks.   DATE TEST EF   3/19 Echo   55 %         Date Cr K Hgb  12/19 0.65 3.4 14.1   11/20 0.7 4.7 13.1  6/22 0.68 3.4 14.4  6/23 0.7 4.5 [id number])   Thromboembolic risk factors (, Gender-1) for a CHADSVASc Score of 0   Past Medical History:  Diagnosis Date   AF (atrial fibrillation) (HCC)    Single episode   Endometrioma    RIGHT OVARY   Endometriosis    Osteoporosis 08/28/2016   T score -3.0 lumbar spine, -2.1 left femoral neck      Surgical History:  Past Surgical History:  Procedure Laterality Date   OOPHORECTOMY  2007   LAP RSO   PELVIC LAPAROSCOPY  2007   DX LAP, RSO , LASER VAPORIZATION OF ENDOMETRIOSIS     Home Meds: Current Meds  Medication Sig   alendronate (FOSAMAX) 70 MG tablet Take 70 mg by mouth once a week.   Ascorbic Acid (VITAMIN C PO) Take by mouth.   Calcium Carbonate-Vitamin D (CALCIUM + D PO) Take by  mouth.   Cholecalciferol (VITAMIN D PO) Take by mouth.     diltiazem (CARDIZEM) 30 MG tablet Take 1 tablet (30 mg) by mouth every 6 hours as needed for fast heart rates   IBUPROFEN PO Take by mouth.     MAGNESIUM PO Take by mouth daily.   Multiple Vitamins-Minerals (ONE-A-DAY VITACRAVES IMMUNITY PO) Take by mouth daily. Immunity vitamin   NON FORMULARY Immunity Support    Allergies:  Allergies  Allergen Reactions   Levaquin [Levofloxacin In D5w] Anaphylaxis    "Suicidal thoughts"   Tobramycin Itching    Social History   Socioeconomic History   Marital status: Married    Spouse name: Not on file   Number of children: Not on file   Years of education: Not on file   Highest education level: Not on file  Occupational History   Not on file  Tobacco Use   Smoking status: Never   Smokeless tobacco: Never  Vaping Use   Vaping Use: Never used  Substance and Sexual Activity   Alcohol use: No    Alcohol/week: 0.0 standard drinks of alcohol   Drug use: No   Sexual activity: Yes    Birth control/protection: Post-menopausal    Comment: 1st intercourse 56 yo-Fewer than 5 partners  Other Topics Concern   Not on file  Social History  Narrative   Not on file   Social Determinants of Health   Financial Resource Strain: Not on file  Food Insecurity: Not on file  Transportation Needs: Not on file  Physical Activity: Not on file  Stress: Not on file  Social Connections: Not on file  Intimate Partner Violence: Not on file     Family History  Problem Relation Age of Onset   Hypertension Mother    Atrial fibrillation Mother    Osteoporosis Mother    Deep vein thrombosis Brother    Heart disease Maternal Uncle    Hypertension Maternal Grandmother    Cancer Maternal Grandmother        BLADDER   Osteoporosis Maternal Grandmother    Atrial fibrillation Maternal Grandmother    Cancer Paternal Grandmother 53       stomach - tumor   Heart disease Maternal Grandfather    Stroke  Maternal Grandfather    Heart disease Paternal Grandfather      ROS:  Please see the history of present illness.   Thank you perfect all other systems reviewed and negative.    BP 100/60 (BP Location: Left Arm, Patient Position: Sitting, Cuff Size: Normal)   Pulse 67   Ht 5\' 7"  (1.702 m)   Wt 142 lb 2 oz (64.5 kg)   LMP 04/16/2013   SpO2 99%   BMI 22.26 kg/m  Well developed and nourished in no acute distress HENT normal Neck supple with JVP-  flat  Clear Regular rate and rhythm, no murmurs or gallops Abd-soft with active BS No Clubbing cyanosis edema Skin-warm and dry A & Oriented  Grossly normal sensory and motor function  ECG sinus at 67 Interval 14/08/40     Assessment and Plan:  Atrial fibrillation-paroxysmal  Hypokalemia resolved  Stress  Anemia  Prediabetes  Interval atrial flutters 6/22.  None since.  Spontaneously resolving.  No indication at this juncture for anticoagulation.  Hemoglobin is low.  We will plan to check again.  Hemoglobin A1c is prediabetic have encouraged her with her exercise  7/22

## 2022-03-31 ENCOUNTER — Ambulatory Visit
Admission: RE | Admit: 2022-03-31 | Discharge: 2022-03-31 | Disposition: A | Discharge status: Home / Self Care | Payer: Managed Care, Other (non HMO) | Source: Ambulatory Visit | Attending: Nurse Practitioner | Admitting: Nurse Practitioner

## 2022-03-31 DIAGNOSIS — Z1231 Encounter for screening mammogram for malignant neoplasm of breast: Secondary | ICD-10-CM

## 2022-04-09 ENCOUNTER — Ambulatory Visit: Payer: Managed Care, Other (non HMO)

## 2022-11-23 ENCOUNTER — Telehealth: Payer: Self-pay | Admitting: Internal Medicine

## 2022-11-23 MED ORDER — DILTIAZEM HCL 30 MG PO TABS: ORAL_TABLET | ORAL | 2 refills | Status: AC

## 2022-11-23 NOTE — Telephone Encounter (Signed)
Requested Prescriptions   Signed Prescriptions Disp Refills   diltiazem (CARDIZEM) 30 MG tablet 30 tablet 2    Sig: Take 1 tablet (30 mg) by mouth every 6 hours as needed for fast heart rates    Authorizing Provider: Duke Salvia    Ordering User: Guerry Minors

## 2022-11-23 NOTE — Telephone Encounter (Signed)
*  STAT* If patient is at the pharmacy, call can be transferred to refill team.   1. Which medications need to be refilled? (please list name of each medication and dose if known) diltiazem (CARDIZEM) 30 MG tablet    2. Would you like to learn more about the convenience, safety, & potential cost savings by using the [facility]?No   3. Are you open to using the [facility] No   4. Which pharmacy/location (including street and city if local pharmacy) is medication to be sent to?[facility] - [place], [place] - [address]    5. Do they need a 30 day or 90 day supply? 30 Day Supply     Pt is currently out and will be going out of town tomorrow.

## 2023-01-11 ENCOUNTER — Other Ambulatory Visit: Payer: Self-pay | Admitting: Nurse Practitioner

## 2023-01-11 DIAGNOSIS — Z1231 Encounter for screening mammogram for malignant neoplasm of breast: Secondary | ICD-10-CM

## 2023-01-26 ENCOUNTER — Ambulatory Visit: Payer: Managed Care, Other (non HMO) | Admitting: Nurse Practitioner

## 2023-02-05 IMAGING — US US BREAST*L* LIMITED INC AXILLA
1 series · 5 of 5 positions shown · non-contrast
Comparison: Previous exam(s).

CLINICAL DATA: Patient returns after screening study for evaluation
of possible LEFT breast mass.

EXAM:
DIGITAL DIAGNOSTIC UNILATERAL LEFT MAMMOGRAM WITH TOMOSYNTHESIS AND
CAD; ULTRASOUND LEFT BREAST LIMITED
TECHNIQUE: Left digital diagnostic mammography and breast tomosynthesis was
performed. The images were evaluated with computer-aided detection.;
Targeted ultrasound examination of the left breast was performed.

[Series 1: us breast*left* limited inc axilla · 0.06mm/px · 5 of 5 slices shown]
[im 1/5]
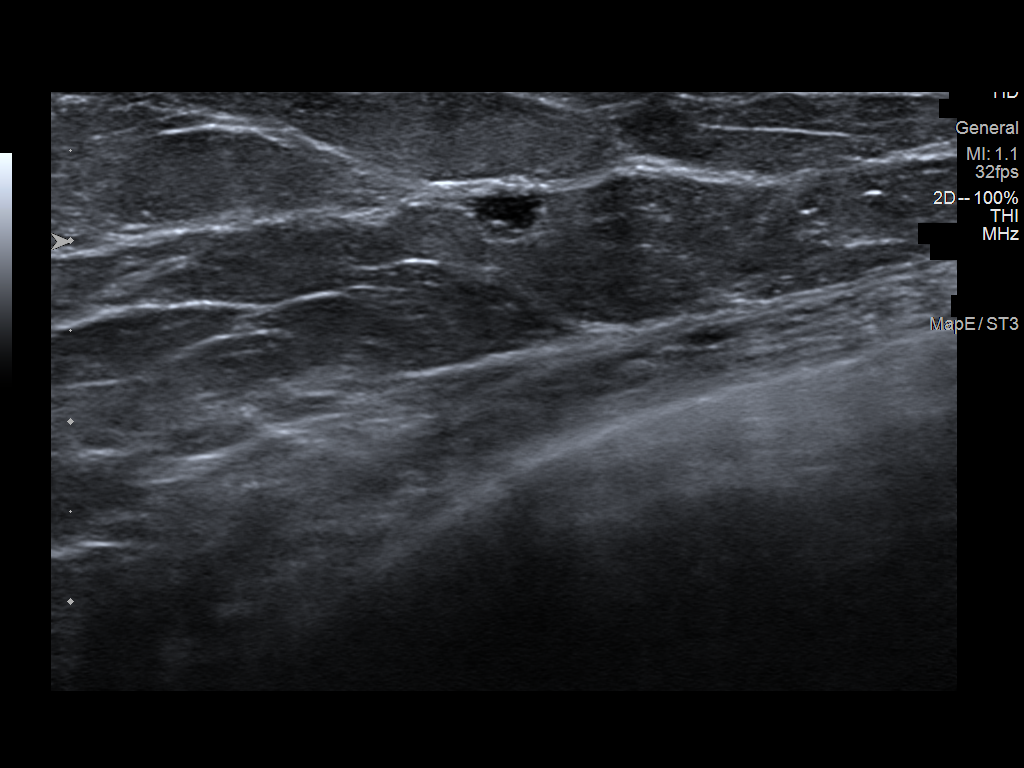
[im 2/5]
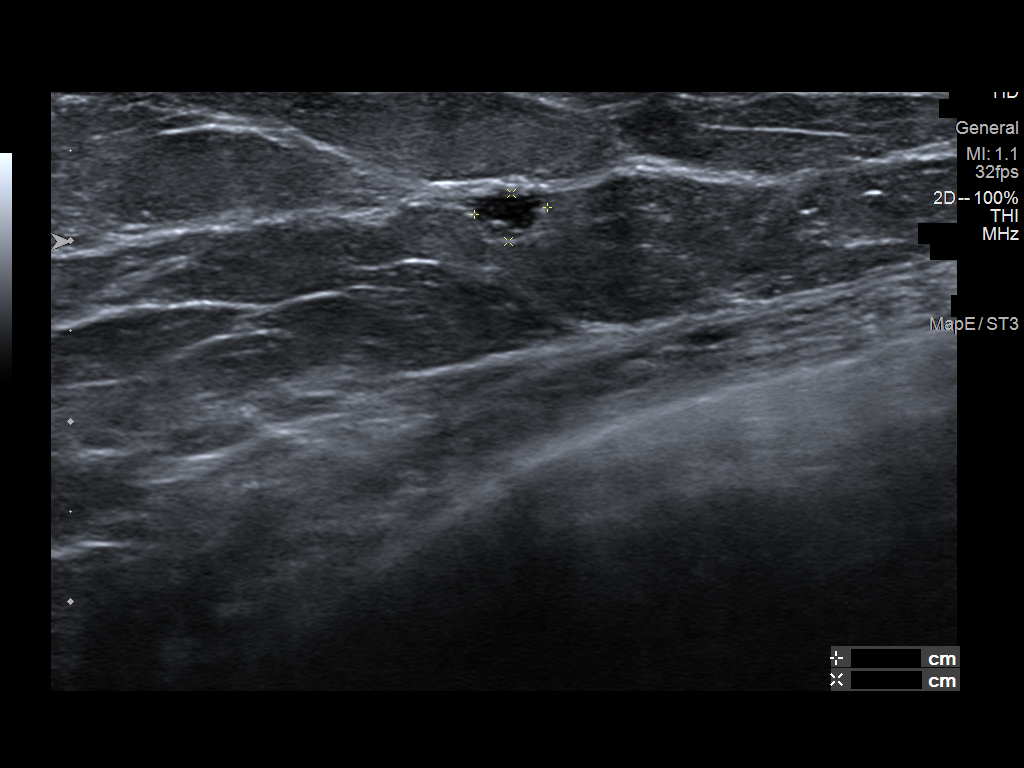
[im 3/5]
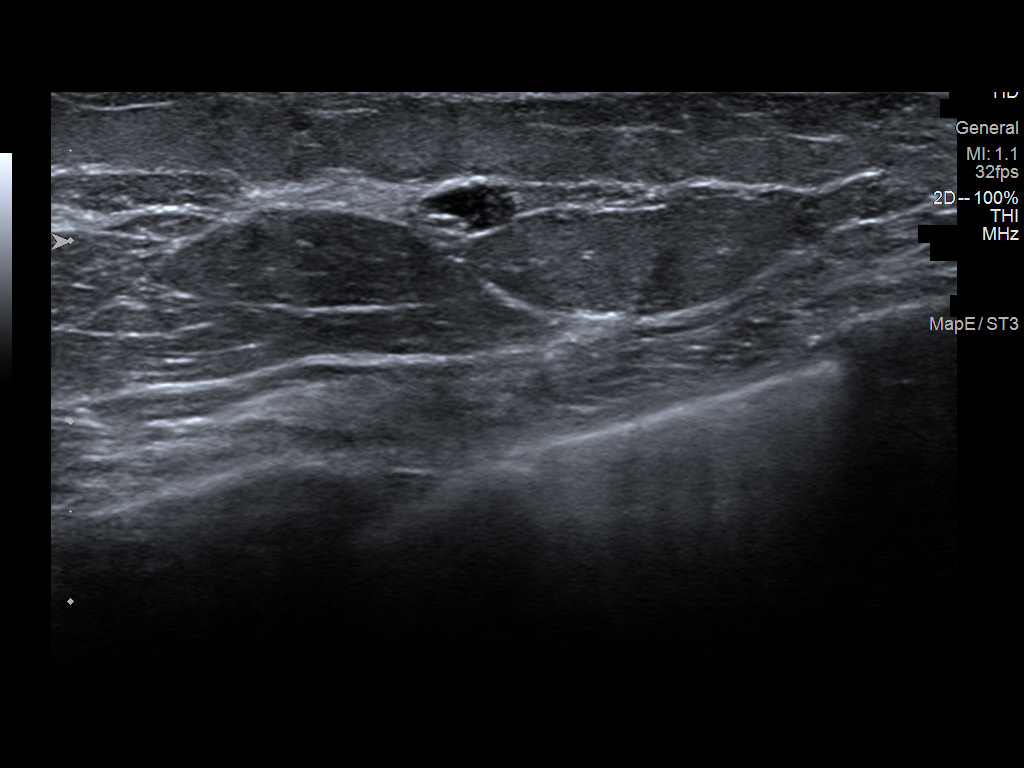
[im 4/5]
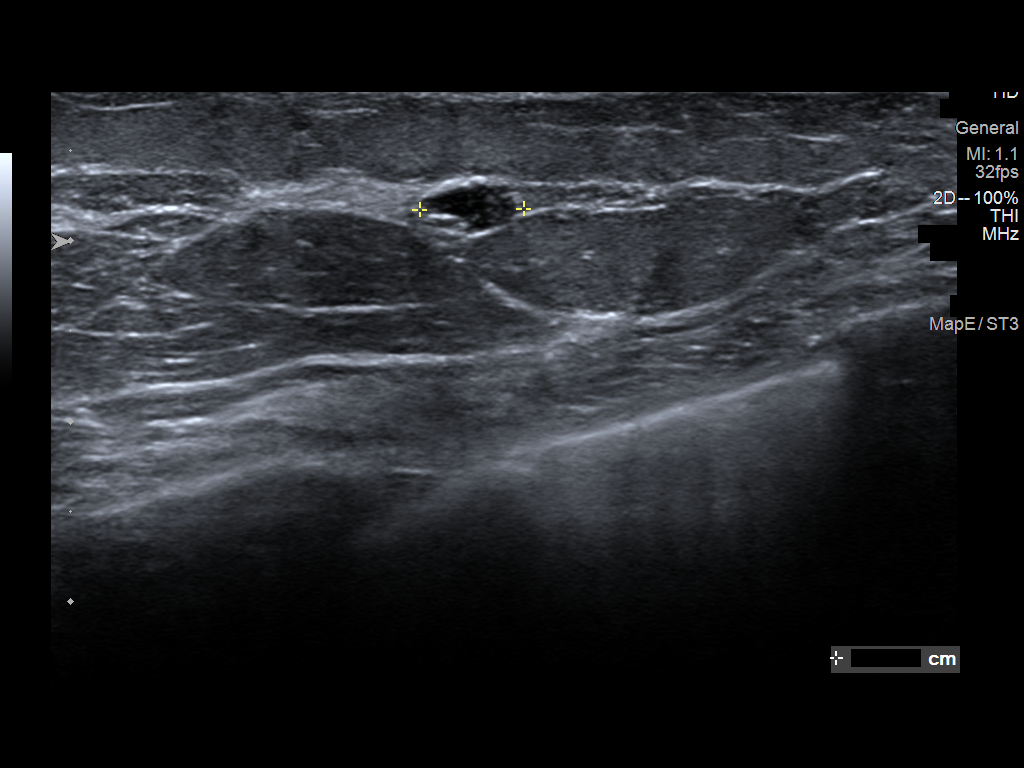
[im 5/5]
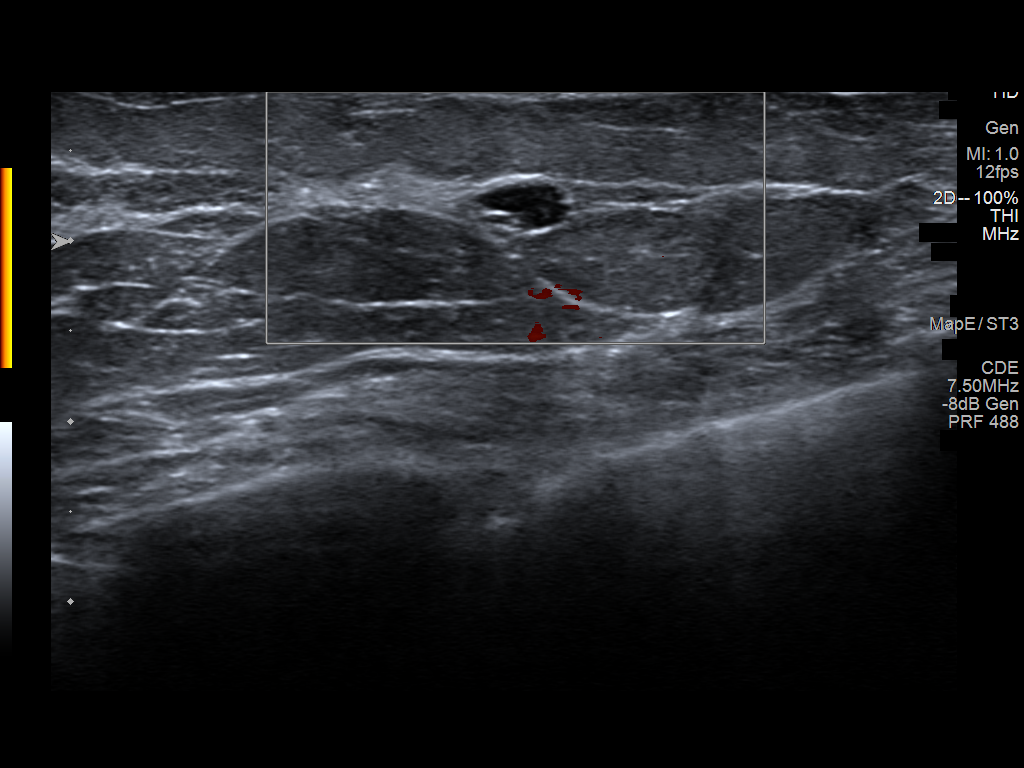

[5 of 5 positions shown; findings below may reference images not displayed]

ACR Breast Density Category b: There are scattered areas of
fibroglandular density.
FINDINGS: Additional 2-D and 3-D images are performed. These views show
persistent circumscribed oval mass in the LATERAL portion of the
LEFT breast and further evaluated with ultrasound.

On physical exam, I palpate no abnormality in the LATERAL aspect of
the LEFT breast.

Targeted ultrasound is performed, showing an anechoic cyst in the
3:30 o'clock location of the LEFT breast 6 centimeters from the
nipple measuring 0.4 x 0.3 x 0.6 centimeters. No solid component or
areas of acoustic shadowing in this portion of the breast.
IMPRESSION: Benign cyst in the LEFT breast. No mammographic or ultrasound
evidence for malignancy.

RECOMMENDATION:
Screening mammogram in one year.(Code:S5-V-TGD)

I have discussed the findings and recommendations with the patient.
If applicable, a reminder letter will be sent to the patient
regarding the next appointment.

BI-RADS CATEGORY  2: Benign.

## 2023-02-09 ENCOUNTER — Ambulatory Visit: Payer: Managed Care, Other (non HMO) | Admitting: Nurse Practitioner

## 2023-04-06 ENCOUNTER — Ambulatory Visit: Payer: Managed Care, Other (non HMO)

## 2023-04-20 ENCOUNTER — Ambulatory Visit: Payer: Managed Care, Other (non HMO)

## 2023-04-28 ENCOUNTER — Ambulatory Visit: Payer: Managed Care, Other (non HMO) | Admitting: Nurse Practitioner

## 2023-05-06 ENCOUNTER — Ambulatory Visit: Payer: Managed Care, Other (non HMO)

## 2023-05-07 ENCOUNTER — Ambulatory Visit: Payer: Managed Care, Other (non HMO) | Admitting: Internal Medicine

## 2023-05-25 ENCOUNTER — Ambulatory Visit
Admission: RE | Admit: 2023-05-25 | Discharge: 2023-05-25 | Disposition: A | Discharge status: Home / Self Care | Payer: Managed Care, Other (non HMO) | Source: Ambulatory Visit | Attending: Nurse Practitioner

## 2023-05-25 DIAGNOSIS — Z1231 Encounter for screening mammogram for malignant neoplasm of breast: Secondary | ICD-10-CM

## 2023-06-17 ENCOUNTER — Ambulatory Visit: Payer: Managed Care, Other (non HMO) | Admitting: Internal Medicine

## 2023-06-29 ENCOUNTER — Other Ambulatory Visit (HOSPITAL_COMMUNITY)
Admission: RE | Admit: 2023-06-29 | Discharge: 2023-06-29 | Disposition: A | Discharge status: Home / Self Care | Source: Ambulatory Visit | Attending: Nurse Practitioner | Admitting: Nurse Practitioner

## 2023-06-29 ENCOUNTER — Encounter: Payer: Self-pay | Admitting: Nurse Practitioner

## 2023-06-29 ENCOUNTER — Ambulatory Visit (INDEPENDENT_AMBULATORY_CARE_PROVIDER_SITE_OTHER): Payer: Managed Care, Other (non HMO) | Admitting: Nurse Practitioner

## 2023-06-29 VITALS — BP 110/70 | HR 77 | Ht 65.5 in | Wt 152.0 lb

## 2023-06-29 DIAGNOSIS — Z1331 Encounter for screening for depression: Secondary | ICD-10-CM

## 2023-06-29 DIAGNOSIS — Z01419 Encounter for gynecological examination (general) (routine) without abnormal findings: Secondary | ICD-10-CM

## 2023-06-29 DIAGNOSIS — Z780 Asymptomatic menopausal state: Secondary | ICD-10-CM | POA: Diagnosis not present

## 2023-06-29 DIAGNOSIS — Z124 Encounter for screening for malignant neoplasm of cervix: Secondary | ICD-10-CM | POA: Insufficient documentation

## 2023-06-29 DIAGNOSIS — M810 Age-related osteoporosis without current pathological fracture: Secondary | ICD-10-CM | POA: Diagnosis not present

## 2023-06-29 NOTE — Progress Notes (Signed)
 [name] [birth year] [id number]   History:  58 y.o. G0 presents for annual exam. Postmenopausal - no HRT, no bleeding. S/P 2007 RSO for endometriosis. Normal pap history. Was on Fosamax x 5 years, stopped last year, managed by endocrinology. Considering Prolia or restarting Fosamax. Cardiology managing A fib.   Gynecologic History Patient's last menstrual period was 04/16/2013.   Contraception/Family planning: post menopausal status Sexually active: Yes  Health Maintenance Last Pap: 12/23/2021. Results were: Normal Last mammogram: 05/25/2023. Results were: Normal Last colonoscopy: 10/20/2019. Results were: Tubular adenoma, 5-year recall Last Dexa: 09/08/2021. Results were: T-score -2.8     06/29/2023    4:05 PM  Depression screen PHQ 2/9  Decreased Interest 0  Down, Depressed, Hopeless 0  PHQ - 2 Score 0     Past medical history, past surgical history, family history and social history were all reviewed and documented in the EPIC chart. 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   ROS:  A ROS was performed and pertinent positives and negatives are included.  Exam:  Vitals:   06/29/23 1607  BP: 110/70  Pulse: 77  SpO2: 98%  Weight: 152 lb (68.9 kg)  Height: 5' 5.5" (1.664 m)     Body mass index is 24.91 kg/m.  General appearance:  Normal Thyroid:  Symmetrical, normal in size, without palpable masses or nodularity. Respiratory  Auscultation:  Clear without wheezing or rhonchi Cardiovascular  Auscultation:  Regular rate, without rubs, murmurs or gallops  Edema/varicosities:  Not grossly evident Abdominal  Soft,nontender, without masses, guarding or rebound.  Liver/spleen:  No organomegaly noted  Hernia:  None appreciated  Skin  Inspection:  Grossly normal Breasts: Examined lying and sitting.   Right: Without masses, retractions, nipple discharge or axillary adenopathy.   Left: Without masses, retractions, nipple discharge or axillary adenopathy. Pelvic: External  genitalia:  no lesions              Urethra:  normal appearing urethra with no masses, tenderness or lesions              Bartholins and Skenes: normal                 Vagina: normal appearing vagina with normal color and discharge, no lesions. Atrophic changes              Cervix: no lesions Bimanual Exam:  Uterus:  no masses or tenderness              Adnexa: no mass, fullness, tenderness              Rectovaginal: Deferred              Anus:  normal, no lesions  Patient informed chaperone available to be present for breast and pelvic exam. Patient has requested no chaperone to be present. Patient has been advised what will be completed during breast and pelvic exam.   Assessment/Plan:  58 y.o. G0 for annual exam.   Well female exam with routine gynecological exam - Education provided on SBEs, importance of preventative screenings, current guidelines, high calcium diet, regular exercise, and multivitamin daily.  Labs done at her work and with PCP.   Postmenopausal - no HRT, no bleeding.   Age-related osteoporosis without current pathological fracture - managed by endocrinology. Stopped Fosamax last summer after 5 years. Considering Prolia or restarting Fosamax. 08/2021 T-score -2.8.  Cervical cancer screening - Plan: Cytology - PAP( [place]). Normal pap history.   Screening for breast  cancer - Normal mammogram history. Continue annual screenings. Normal breast exam today.  Screening for colon cancer - 2021 colonoscopy. Will repeat at GI's recommended interval.   Return in about 1 year (around 06/28/2024) for Annual.     Andee Bamberger DNP, 4:40 PM 06/29/2023

## 2023-07-01 ENCOUNTER — Encounter: Payer: Self-pay | Admitting: Nurse Practitioner

## 2023-07-01 LAB — CYTOLOGY - PAP: Diagnosis: NEGATIVE

## 2023-07-22 ENCOUNTER — Ambulatory Visit: Admitting: Internal Medicine

## 2023-08-31 NOTE — Progress Notes (Signed)
 Electrophysiology Clinic Note    Date:  09/01/2023  Patient ID:  Kimberly, Goodman 1966-02-09, MRN [id number] PCP:  Nikki Barters, MD  Cardiologist:  None Electrophysiologist: Richardo Chandler, MD   Discussed the use of AI scribe software for clinical note transcription with the patient, who gave verbal consent to proceed.   Patient Profile    Chief Complaint: AFib follow-up  History of Present Illness: [name] is a 58 y.o. female with PMH notable for parox afib, parox aflutter; seen today for Richardo Chandler, MD for routine electrophysiology followup.   She last saw Dr. Rodolfo Clan 02/2022.  Had no further A-fib, a flutter episodes.  No OAC due to low CHA2DS2-VASc score of 1.  On follow-up today, she has not had any AF episodes since last being seen.  Has not needed any as needed diltiazem .  She does remember that each of her previous atrial fibrillation episodes was in the setting of fasting with hypokalemia discovered in the ER. She does note heart racing symptoms during hot flashes which resolve when the hot flash passes.   Of note, her mother and father are both patient's of Dr. Doyle Generous. Mother has AFib.      Arrhythmia/Device History No specialty comments available.     ROS:  Please see the history of present illness. All other systems are reviewed and otherwise negative.    Physical Exam    VS:  BP 118/77 (BP Location: Right Arm, Patient Position: Sitting, Cuff Size: Normal)   Pulse 67   Ht 5' 7 (1.702 m)   Wt 154 lb 6.4 oz (70 kg)   LMP 04/16/2013   SpO2 99%   BMI 24.18 kg/m  BMI: Body mass index is 24.18 kg/m.  Wt Readings from Last 3 Encounters:  09/01/23 154 lb 6.4 oz (70 kg)  06/29/23 152 lb (68.9 kg)  03/12/22 142 lb 2 oz (64.5 kg)     GEN- The patient is well appearing, alert and oriented x 3 today.   Lungs- Clear to ausculation bilaterally, normal work of breathing.  Heart- Irregularly irregular rate and rhythm, no murmurs, rubs or  gallops Extremities- No peripheral edema, warm, dry    Studies Reviewed   Previous EP, cardiology notes.    EKG is ordered. Personal review of EKG from today shows:  SR at 67bpm        TTE, 06/01/2017 (via care everywhere) NORMAL LEFT VENTRICULAR SYSTOLIC FUNCTION  NORMAL RIGHT VENTRICULAR SYSTOLIC FUNCTION  MILD VALVULAR REGURGITATION (See above)  NO VALVULAR STENOSIS       Assessment and Plan     #) sub-clinical AFib, aflutter No recent episodes Previous A-fib, a flutter episodes were always in the setting of fasting with hypokalemia Recommended she avoid fasting if possible and ensure adequate potassium intake Continue 30 mg diltiazem  as needed for A-fib episodes   #) Hypercoag d/t parox afib CHA2DS2-VASc Score = at least 1 [CHF History: 0, HTN History: 0, Diabetes History: 0, Stroke History: 0, Vascular Disease History: 0, Age Score: 0, Gender Score: 1].  Therefore, the patient's annual risk of stroke is 0.6 % We discussed her very low A-fib burden and chads Vasc score likely overestimates her stroke risk Discussed that if her A-fib burden increases DOAC would be recommended.          Current medicines are reviewed at length with the patient today.   The patient does not have concerns regarding her medicines.  The following changes were  made today:  none  Labs/ tests ordered today include:  Orders Placed This Encounter  Procedures   EKG 12-Lead     Disposition: Follow up with Dr. Daneil Dunker or EP APP in 12 months, prefer MD to establish with new provider  I spent 30 minutes of the care of this patient today including 25 minutes in face-to-face consultation.   Signed, Denni France, NP  09/01/23  4:06 PM  Electrophysiology [facility]

## 2023-09-01 ENCOUNTER — Ambulatory Visit: Attending: Cardiology | Admitting: Cardiology

## 2023-09-01 VITALS — BP 118/77 | HR 67 | Ht 67.0 in | Wt 154.4 lb

## 2023-09-01 DIAGNOSIS — I4892 Unspecified atrial flutter: Secondary | ICD-10-CM

## 2023-09-01 DIAGNOSIS — I480 Paroxysmal atrial fibrillation: Secondary | ICD-10-CM | POA: Diagnosis not present

## 2023-09-01 NOTE — Patient Instructions (Signed)
 Medication Instructions:  The current medical regimen is effective;  continue present plan and medications as directed. Please refer to the Current Medication list given to you today.   *If you need a refill on your cardiac medications before your next appointment, please call your pharmacy*   Follow-Up: At [facility], you and your health needs are our priority.  As part of our continuing mission to provide you with exceptional heart care, our providers are all part of one team.  This team includes your primary Cardiologist (physician) and Advanced Practice Providers or APPs (Physician Assistants and Nurse Practitioners) who all work together to provide you with the care you need, when you need it.  Your next appointment:   12 month(s)  Provider:   Ardeen Kohler, MD or Suzann Riddle, NP    We recommend signing up for the patient portal called "MyChart".  Sign up information is provided on this After Visit Summary.  MyChart is used to connect with patients for Virtual Visits (Telemedicine).  Patients are able to view lab/test results, encounter notes, upcoming appointments, etc.  Non-urgent messages can be sent to your provider as well.   To learn more about what you can do with MyChart, go to [email or web address].

## 2024-03-29 NOTE — Progress Notes (Signed)
 [name] is a  59 y.o. female who presents for  CHIEF COMPLAINT Chief Complaint  Patient presents with   Flu-Like Symptoms    Patient complains of post nasal drip, productive cough with yellowish phlegm. Chest soreness.     Subjective: History of Present Illness  Pt in with persistent cough and drainage with ST and low grade fever. Tested negative. Last week. Finished her Zpak   Past Medical History:  Diagnosis Date   Endometriosis 09/28/2014   Osteoporosis    Prediabetes    Patient Active Problem List  Diagnosis   Endometriosis   Prediabetes   Post-menopausal osteoporosis   PSVT (paroxysmal supraventricular tachycardia) (HHS-HCC)    Past Surgical History:  Procedure Laterality Date   right ovary removed  2007     Current Outpatient Medications:    alendronate (FOSAMAX) 70 MG tablet, One tab every Sunday on empty stomach with a full glass of water. Do not lie down or eat/drink anything else for the next 30 min., Disp: 12 tablet, Rfl: 3   calcium carbonate-vitamin D3 600mg  (1,000mg ) -1,000 unit Tab, Take 500 mg by mouth once daily, Disp: , Rfl:    cholecalciferol (VITAMIN D3) 2,000 unit capsule, Take 2,000 Units by mouth once daily.  , Disp: , Rfl:    dilTIAZem  (CARDIZEM ) 30 MG immediate release tablet, TAKE 1 TABLET BY MOUTH EVERY 6 HOURS AS NEEDED, Disp: 30 tablet, Rfl: 0   fluticasone propionate (FLONASE) 50 mcg/actuation nasal spray, Place 1 spray into both nostrils 2 (two) times daily, Disp: 16 g, Rfl: 0   hydrocortisone-pramoxine (ANALPRAM-HC) 2.5-1 % rectal cream, APPLY RECTALLY 3 TIMES DAILY FOR 10 DAYS, Disp: 30 g, Rfl: 1   ibuprofen 200 mg Cap, Take 200 mg by mouth as needed, Disp: , Rfl:    magnesium oxide (MAG-OX) 400 mg (241.3 mg magnesium) tablet, Take 500 mg by mouth once daily   , Disp: , Rfl:    multivitamin tablet, Take 1 tablet by mouth once daily.  , Disp: , Rfl:    UNABLE TO FIND, Immunity support tablet daily, Disp: , Rfl:     UNABLE TO FIND, 1,000 Units as needed Med Name: Lyzine, Disp: , Rfl:   Levofloxacin, Omnicef [cefdinir], and Tobrex [tobramycin]  Social History   Socioeconomic History   Marital status: Married  Tobacco Use   Smoking status: Never   Smokeless tobacco: Never  Vaping Use   Vaping status: Never Used  Substance and Sexual Activity   Alcohol use: No    Alcohol/week: 0.0 standard drinks of alcohol   Drug use: Never   Sexual activity: Defer   Social Drivers of Health   Housing Stability: Unknown (05/05/2023)   Housing Stability Vital Sign    Homeless in the Last Year: No    Family History  Problem Relation Name Age of Onset   High blood pressure (Hypertension) Mother     Osteoporosis (Thinning of bones) Mother     High blood pressure (Hypertension) Maternal Grandmother     Osteoporosis (Thinning of bones) Maternal Grandmother     High blood pressure (Hypertension) Maternal Grandfather     Osteoporosis (Thinning of bones) Paternal Grandmother     Heart disease Paternal Grandfather     Myocardial Infarction (Heart attack) Paternal Grandfather      A comprehensive ROS was negative except for HPI  PE: BP 110/70   Pulse 97   Temp 37.4 C (99.3 F) (Oral)   Wt 68.9 kg (152 lb)  SpO2 97%   BMI 24.17 kg/m  The patient looks well today. She is in no distress. Neck is supple without adenopathy.  Carotids are 2+ bilaterally without bruits. Lungs are clear. Heart is regular rate and rhythm without murmurs, rubs, or gallops. Abdomen is soft, non tender, no masses or organomegaly. Lower extremities without obvious edema.  HEENT negative  Appointment on 03/24/2024  Component Date Value Ref Range Status   Influenza A PCR 03/24/2024 Negative  Negative Final   Influenza B PCR 03/24/2024 Negative  Negative Final   RSV PCR 03/24/2024 Negative  Negative Final   SARS-CoV2 PCR 03/24/2024 Negative  Negative Final  Nurse Only on 02/16/2024  Component Date Value Ref  Range Status   Vent Rate (bpm) 02/16/2024 85   Preliminary   PR Interval (msec) 02/16/2024 154   Preliminary   QRS Interval (msec) 02/16/2024 78   Preliminary   QT Interval (msec) 02/16/2024 382   Preliminary   QTc (msec) 02/16/2024 454   Preliminary   Color 02/16/2024 Yellow  Colorless, Straw, Light Yellow, Yellow, Dark Yellow Final   Clarity 02/16/2024 Clear  Clear Final   Specific Gravity 02/16/2024 1.026  1.005 - 1.030 Final   pH, Urine 02/16/2024 7.0  5.0 - 8.0 Final   Protein, Urinalysis 02/16/2024 Trace (!)  Negative mg/dL Final   Glucose, Urinalysis 02/16/2024 Negative  Negative mg/dL Final   Ketones, Urinalysis 02/16/2024 Negative  Negative mg/dL Final   Blood, Urinalysis 02/16/2024 Negative  Negative Final   Nitrite, Urinalysis 02/16/2024 Negative  Negative Final   Leukocyte Esterase, Urinalysis 02/16/2024 Negative  Negative Final   Bilirubin, Urinalysis 02/16/2024 Negative  Negative Final   Urobilinogen, Urinalysis 02/16/2024 0.2  0.2 - 1.0 mg/dL Final   WBC, UA 87/96/7974 1  <=5 /hpf Final   Red Blood Cells, Urinalysis 02/16/2024 3  <=3 /hpf Final   Bacteria, Urinalysis 02/16/2024 0-5  0 - 5 /hpf Final   Squamous Epithelial Cells, Urinaly* 02/16/2024 0  /hpf Final   Glucose 02/16/2024 111 (H)  70 - 110 mg/dL Final   Sodium 87/96/7974 141  136 - 145 mmol/L Final   Potassium 02/16/2024 4.7  3.6 - 5.1 mmol/L Final   Chloride 02/16/2024 106  97 - 109 mmol/L Final   Carbon Dioxide (CO2) 02/16/2024 29.4  22.0 - 32.0 mmol/L Final   Urea Nitrogen (BUN) 02/16/2024 17  7 - 25 mg/dL Final   Creatinine 87/96/7974 0.6  0.6 - 1.1 mg/dL Final   Glomerular Filtration Rate (eGFR) 02/16/2024 103  >60 mL/min/1.73sq m Final   Calcium 02/16/2024 9.7  8.7 - 10.3 mg/dL Final   AST  87/96/7974 23  8 - 39 U/L Final   ALT  02/16/2024 11  5 - 38 U/L Final   Alk Phos (alkaline Phosphatase) 02/16/2024 63  34 - 104 U/L Final   Albumin 02/16/2024 4.5  3.5 - 4.8 g/dL  Final   Bilirubin, Total 02/16/2024 0.8  0.3 - 1.2 mg/dL Final   Protein, Total 02/16/2024 7.6  6.1 - 7.9 g/dL Final   A/G Ratio 87/96/7974 1.5  1.0 - 5.0 gm/dL Final   Cholesterol, Total 02/16/2024 240 (H)  100 - 200 mg/dL Final   Triglyceride 87/96/7974 77  35 - 199 mg/dL Final   HDL (High Density Lipoprotein) Cho* 02/16/2024 106.5 (H)  35.0 - 85.0 mg/dL Final   LDL Calculated 02/16/2024 881  0 - 130 mg/dL Final   VLDL Cholesterol 02/16/2024 15  mg/dL Final   Cholesterol/HDL Ratio 02/16/2024 2.3  Final   Thyroid Stimulating Hormone (TSH) 02/16/2024 6.135 (H)  0.450-5.330 uIU/ml uIU/mL Final   Hemoglobin A1C 02/16/2024 6.0 (H)  4.2 - 5.6 % Final   Average Blood Glucose (Calc) 02/16/2024 126  mg/dL Final   Magnesium 87/96/7974 2.2  1.8 - 2.5 mg/dL Final   Thyroxine, Free (FT4) 02/16/2024 0.81  0.66 - 1.14 ng/dL Final  Appointment on 92/68/7974  Component Date Value Ref Range Status   Color 10/14/2023 Light Yellow  Colorless, Straw, Light Yellow, Yellow, Dark Yellow Final   Clarity 10/14/2023 Clear  Clear Final   Specific Gravity 10/14/2023 1.015  1.005 - 1.030 Final   pH, Urine 10/14/2023 7.5  5.0 - 8.0 Final   Protein, Urinalysis 10/14/2023 Negative  Negative mg/dL Final   Glucose, Urinalysis 10/14/2023 Negative  Negative mg/dL Final   Ketones, Urinalysis 10/14/2023 Trace (!)  Negative mg/dL Final   Blood, Urinalysis 10/14/2023 Negative  Negative Final   Nitrite, Urinalysis 10/14/2023 Negative  Negative Final   Leukocyte Esterase, Urinalysis 10/14/2023 Negative  Negative Final   Bilirubin, Urinalysis 10/14/2023 Negative  Negative Final   Urobilinogen, Urinalysis 10/14/2023 0.2  0.2 - 1.0 mg/dL Final   WBC, UA 92/68/7974 <1  <=5 /hpf Final   Red Blood Cells, Urinalysis 10/14/2023 1  <=3 /hpf Final   Bacteria, Urinalysis 10/14/2023 0-5  0 - 5 /hpf Final   Squamous Epithelial Cells, Urinaly* 10/14/2023 0  /hpf Final   Urine Culture, Routine - [facility]  10/14/2023 Final report   Final   Result 1 - [facility] 10/14/2023 No growth   Final  Appointment on 09/22/2023  Component Date Value Ref Range Status   Glucose 09/22/2023 98  70 - 110 mg/dL Final   Sodium 92/90/7974 139  136 - 145 mmol/L Final   Potassium 09/22/2023 4.4  3.6 - 5.1 mmol/L Final   Chloride 09/22/2023 103  97 - 109 mmol/L Final   Carbon Dioxide (CO2) 09/22/2023 27.3  22.0 - 32.0 mmol/L Final   Calcium 09/22/2023 9.8  8.7 - 10.3 mg/dL Final   Urea Nitrogen (BUN) 09/22/2023 20  7 - 25 mg/dL Final   Creatinine 92/90/7974 0.7  0.6 - 1.1 mg/dL Final   Glomerular Filtration Rate (eGFR) 09/22/2023 100  >60 mL/min/1.73sq m Final   BUN/Crea Ratio 09/22/2023 28.6 (H)  6.0 - 20.0 Final   Anion Gap w/K 09/22/2023 13.1  6.0 - 16.0 Final  Same Day Visit on 06/14/2023  Component Date Value Ref Range Status   WBC (White Blood Cell Count) 06/14/2023 3.5 (L)  4.1 - 10.2 103/uL Final   RBC (Red Blood Cell Count) 06/14/2023 4.15  4.04 - 5.48 106/uL Final   Hemoglobin 06/14/2023 12.8  12.0 - 15.0 gm/dL Final   Hematocrit 96/68/7974 39.7  35.0 - 47.0 % Final   MCV (Mean Corpuscular Volume) 06/14/2023 95.7  80.0 - 100.0 fl Final   MCH (Mean Corpuscular Hemoglobin) 06/14/2023 30.8  27.0 - 31.2 pg Final   MCHC (Mean Corpuscular Hemoglobin * 06/14/2023 32.2  32.0 - 36.0 gm/dL Final   Platelet Count 06/14/2023 245  150 - 450 103/uL Final   RDW-CV (Red Cell Distribution Widt* 06/14/2023 13.3  11.6 - 14.8 % Final   MPV (Mean Platelet Volume) 06/14/2023 9.4  9.4 - 12.4 fl Final   Neutrophils 06/14/2023 1.81  1.50 - 7.80 103/uL Final   Lymphocytes 06/14/2023 1.16  1.00 - 3.60 103/uL Final   Monocytes 06/14/2023 0.18  0.00 - 1.50 103/uL Final   Eosinophils 06/14/2023 0.24  0.00 -  0.55 103/uL Final   Basophils 06/14/2023 0.05  0.00 - 0.09 103/uL Final   Neutrophil % 06/14/2023 52.5  32.0 - 70.0 % Final   Lymphocyte % 06/14/2023 33.6  10.0 - 50.0 % Final   Monocyte %  06/14/2023 5.2  4.0 - 13.0 % Final   Eosinophil % 06/14/2023 7.0 (H)  1.0 - 5.0 % Final   Basophil% 06/14/2023 1.4  0.0 - 2.0 % Final   Immature Granulocyte % 06/14/2023 0.3  <=0.7 % Final   Immature Granulocyte Count 06/14/2023 0.01  <=0.06 10^3/L Final   Glucose 06/14/2023 77  70 - 110 mg/dL Final   Sodium 96/68/7974 140  136 - 145 mmol/L Final   Potassium 06/14/2023 4.4  3.6 - 5.1 mmol/L Final   Chloride 06/14/2023 106  97 - 109 mmol/L Final   Carbon Dioxide (CO2) 06/14/2023 30.1  22.0 - 32.0 mmol/L Final   Urea Nitrogen (BUN) 06/14/2023 23  7 - 25 mg/dL Final   Creatinine 96/68/7974 0.6  0.6 - 1.1 mg/dL Final   Glomerular Filtration Rate (eGFR) 06/14/2023 103  >60 mL/min/1.73sq m Final   Calcium 06/14/2023 9.7  8.7 - 10.3 mg/dL Final   AST  96/68/7974 20  8 - 39 U/L Final   ALT  06/14/2023 9  5 - 38 U/L Final   Alk Phos (alkaline Phosphatase) 06/14/2023 62  34 - 104 U/L Final   Albumin 06/14/2023 4.1  3.5 - 4.8 g/dL Final   Bilirubin, Total 06/14/2023 0.4  0.3 - 1.2 mg/dL Final   Protein, Total 06/14/2023 7.1  6.1 - 7.9 g/dL Final   A/G Ratio 96/68/7974 1.4  1.0 - 5.0 gm/dL Final   Thyroid Stimulating Hormone (TSH) 06/14/2023 3.896  0.450-5.330 uIU/ml uIU/mL Final   Hemoglobin A1C 06/14/2023 5.9 (H)  4.2 - 5.6 % Final   Average Blood Glucose (Calc) 06/14/2023 123  mg/dL Final   Cholesterol, Total 06/14/2023 198  100 - 200 mg/dL Final   Triglyceride 96/68/7974 73  35 - 199 mg/dL Final   HDL (High Density Lipoprotein) Cho* 06/14/2023 87.7 (H)  35.0 - 85.0 mg/dL Final   LDL Calculated 06/14/2023 96  0 - 130 mg/dL Final   VLDL Cholesterol 06/14/2023 15  mg/dL Final   Cholesterol/HDL Ratio 06/14/2023 2.3   Final  Appointment on 06/01/2023  Component Date Value Ref Range Status   Influenza A PCR 06/01/2023 Negative  Negative Final   Influenza B PCR 06/01/2023 Negative  Negative Final   RSV PCR 06/01/2023 Negative  Negative Final   SARS-CoV2 PCR  06/01/2023 Negative  Negative Final  Office Visit on 06/01/2023  Component Date Value Ref Range Status   Xpress Strep A, PCR 06/01/2023 Not Detected  Not Detected, INVALID Final  Appointment on 04/20/2023  Component Date Value Ref Range Status   Influenza A PCR 04/20/2023 Positive (!)  Negative Final   Influenza B PCR 04/20/2023 Negative  Negative Final   RSV PCR 04/20/2023 Negative  Negative Final   SARS-CoV2 PCR 04/20/2023 Negative  Negative Final   DIAGNOSIS: URI, acute  (primary encounter diagnosis)   PLAN: Re-test today. Meds sent. Call if worse     Attestation Statement:   I personally performed the service. (TP)  Reyes JONETTA Costa, MD, MD

## 2024-04-05 ENCOUNTER — Other Ambulatory Visit: Payer: Self-pay

## 2024-04-05 ENCOUNTER — Emergency Department

## 2024-04-05 ENCOUNTER — Observation Stay
Admission: EM | Admit: 2024-04-05 | Discharge: 2024-04-06 | Disposition: A | Discharge status: Home / Self Care | Source: Ambulatory Visit | Attending: Emergency Medicine | Admitting: Emergency Medicine

## 2024-04-05 DIAGNOSIS — I4891 Unspecified atrial fibrillation: Secondary | ICD-10-CM | POA: Diagnosis not present

## 2024-04-05 LAB — CBC WITH DIFFERENTIAL/PLATELET
Abs Immature Granulocytes: 0.03 K/uL (ref 0.00–0.07)
Basophils Absolute: 0 K/uL (ref 0.0–0.1)
Basophils Relative: 1 %
Eosinophils Absolute: 0 K/uL (ref 0.0–0.5)
Eosinophils Relative: 1 %
HCT: 42.3 % (ref 36.0–46.0)
Hemoglobin: 13.5 g/dL (ref 12.0–15.0)
Immature Granulocytes: 1 %
Lymphocytes Relative: 34 %
Lymphs Abs: 1.8 K/uL (ref 0.7–4.0)
MCH: 29.7 pg (ref 26.0–34.0)
MCHC: 31.9 g/dL (ref 30.0–36.0)
MCV: 93.2 fL (ref 80.0–100.0)
Monocytes Absolute: 0.3 K/uL (ref 0.1–1.0)
Monocytes Relative: 6 %
Neutro Abs: 3.1 K/uL (ref 1.7–7.7)
Neutrophils Relative %: 57 %
Platelets: 346 K/uL (ref 150–400)
RBC: 4.54 MIL/uL (ref 3.87–5.11)
RDW: 13.1 % (ref 11.5–15.5)
WBC: 5.3 K/uL (ref 4.0–10.5)
nRBC: 0 % (ref 0.0–0.2)

## 2024-04-05 LAB — COMPREHENSIVE METABOLIC PANEL WITH GFR
ALT: 13 U/L (ref 0–44)
AST: 21 U/L (ref 15–41)
Albumin: 4.1 g/dL (ref 3.5–5.0)
Alkaline Phosphatase: 60 U/L (ref 38–126)
Anion gap: 14 (ref 5–15)
BUN: 25 mg/dL — ABNORMAL HIGH (ref 6–20)
CO2: 23 mmol/L (ref 22–32)
Calcium: 10.8 mg/dL — ABNORMAL HIGH (ref 8.9–10.3)
Chloride: 104 mmol/L (ref 98–111)
Creatinine, Ser: 0.68 mg/dL (ref 0.44–1.00)
GFR, Estimated: >60 mL/min
Glucose, Bld: 134 mg/dL — ABNORMAL HIGH (ref 70–99)
Potassium: 3.6 mmol/L (ref 3.5–5.1)
Sodium: 142 mmol/L (ref 135–145)
Total Bilirubin: 0.5 mg/dL (ref 0.0–1.2)
Total Protein: 7.5 g/dL (ref 6.5–8.1)

## 2024-04-05 LAB — TROPONIN T, HIGH SENSITIVITY
Troponin T High Sensitivity: 7 ng/L (ref 0–19)
Troponin T High Sensitivity: 14 ng/L (ref 0–19)

## 2024-04-05 LAB — D-DIMER, QUANTITATIVE: D-Dimer, Quant: <0.27 ug{FEU}/mL (ref 0.00–0.50)

## 2024-04-05 LAB — PRO BRAIN NATRIURETIC PEPTIDE: Pro Brain Natriuretic Peptide: 191 pg/mL

## 2024-04-05 LAB — TSH: TSH: 3.33 u[IU]/mL (ref 0.350–4.500)

## 2024-04-05 LAB — PROTIME-INR
INR: 1 (ref 0.8–1.2)
Prothrombin Time: 13.5 s (ref 11.4–15.2)

## 2024-04-05 LAB — MAGNESIUM: Magnesium: 2.1 mg/dL (ref 1.7–2.4)

## 2024-04-05 MED ORDER — SODIUM CHLORIDE 0.9 % IV BOLUS
1000.0000 mL | Freq: Once | INTRAVENOUS | Status: AC
  Administered 2024-04-05: 1000 mL via INTRAVENOUS

## 2024-04-05 MED ORDER — HEPARIN (PORCINE) 25000 UT/250ML-% IV SOLN
950.0000 [IU]/h | INTRAVENOUS | Status: DC
  Administered 2024-04-05: 950 [IU]/h via INTRAVENOUS
  Filled 2024-04-05: qty 250

## 2024-04-05 MED ORDER — HEPARIN BOLUS VIA INFUSION
3400.0000 [IU] | Freq: Once | INTRAVENOUS | Status: AC
  Administered 2024-04-05: 3400 [IU] via INTRAVENOUS
  Filled 2024-04-05: qty 3400

## 2024-04-05 MED ORDER — METOPROLOL TARTRATE 25 MG PO TABS
25.0000 mg | ORAL_TABLET | Freq: Three times a day (TID) | ORAL | Status: DC
  Administered 2024-04-05: 25 mg via ORAL
  Filled 2024-04-05: qty 1

## 2024-04-05 MED ORDER — DILTIAZEM HCL-DEXTROSE 125-5 MG/125ML-% IV SOLN (PREMIX)
5.0000 mg/h | INTRAVENOUS | Status: DC
  Administered 2024-04-05: 5 mg/h via INTRAVENOUS
  Filled 2024-04-05: qty 125

## 2024-04-05 MED ORDER — ONDANSETRON HCL 4 MG/2ML IJ SOLN: 4.0000 mg | Freq: Four times a day (QID) | INTRAMUSCULAR | Status: DC | PRN

## 2024-04-05 MED ORDER — FLECAINIDE ACETATE 100 MG PO TABS
100.0000 mg | ORAL_TABLET | Freq: Once | ORAL | Status: AC
  Administered 2024-04-05: 100 mg via ORAL
  Filled 2024-04-05: qty 1

## 2024-04-05 MED ORDER — ALPRAZOLAM 0.5 MG PO TABS
0.5000 mg | ORAL_TABLET | ORAL | Status: AC
  Administered 2024-04-05: 0.5 mg via ORAL
  Filled 2024-04-05: qty 1

## 2024-04-05 MED ORDER — ENOXAPARIN SODIUM 40 MG/0.4ML IJ SOSY: 40.0000 mg | PREFILLED_SYRINGE | INTRAMUSCULAR | Status: DC

## 2024-04-05 MED ORDER — ACETAMINOPHEN 325 MG PO TABS: 650.0000 mg | ORAL_TABLET | Freq: Four times a day (QID) | ORAL | Status: DC | PRN

## 2024-04-05 MED ORDER — LACTATED RINGERS IV SOLN: INTRAVENOUS | Status: DC

## 2024-04-05 MED ORDER — HYDROXYZINE HCL 10 MG PO TABS
10.0000 mg | ORAL_TABLET | Freq: Once | ORAL | Status: AC
  Administered 2024-04-05: 10 mg via ORAL
  Filled 2024-04-05: qty 1

## 2024-04-05 MED ORDER — DILTIAZEM HCL 25 MG/5ML IV SOLN
10.0000 mg | Freq: Once | INTRAVENOUS | Status: AC
  Administered 2024-04-05: 10 mg via INTRAVENOUS
  Filled 2024-04-05: qty 5

## 2024-04-05 MED ORDER — METOPROLOL TARTRATE 5 MG/5ML IV SOLN: 5.0000 mg | INTRAVENOUS | Status: DC | PRN

## 2024-04-05 MED ORDER — ACETAMINOPHEN 650 MG RE SUPP: 650.0000 mg | Freq: Four times a day (QID) | RECTAL | Status: DC | PRN

## 2024-04-05 MED ORDER — LACTATED RINGERS IV BOLUS
1000.0000 mL | Freq: Once | INTRAVENOUS | Status: AC
  Administered 2024-04-05: 1000 mL via INTRAVENOUS

## 2024-04-05 MED ORDER — SODIUM CHLORIDE 0.9 % IV BOLUS
1000.0000 mL | Freq: Once | INTRAVENOUS | Status: AC
  Administered 2024-04-06: 1000 mL via INTRAVENOUS

## 2024-04-05 MED ORDER — ALPRAZOLAM 0.5 MG PO TABS: 0.5000 mg | ORAL_TABLET | Freq: Three times a day (TID) | ORAL | Status: DC | PRN

## 2024-04-05 MED ORDER — DILTIAZEM HCL 60 MG PO TABS: 30.0000 mg | ORAL_TABLET | Freq: Once | ORAL | Status: DC

## 2024-04-05 MED ORDER — ONDANSETRON HCL 4 MG PO TABS: 4.0000 mg | ORAL_TABLET | Freq: Four times a day (QID) | ORAL | Status: DC | PRN

## 2024-04-05 NOTE — ED Notes (Signed)
 Pt ambulated to toilet without assistance from staff. Pt had a steady, even gait. Pt tolerated activity well. Pt returned to the bed without issue, bed is in the lowest, locked position with call bell in reach.

## 2024-04-05 NOTE — Consult Note (Signed)
 "  CARDIOLOGY CONSULT NOTE               Patient ID: [name] MRN: [id number] DOB/AGE: 60/08/1965 60 y.o.  Admit date: 04/05/2024 Referring Physician Dr. Daved Goodman hospitalist Primary Physician Dr. Chyrl Goodman primary Primary Cardiologist  Reason for Consultation paroxysmal rapid atrial fibrillation  HPI: 59 year old female history of paroxysmal atrial fibrillation potentially related to hypokalemia was treated conservatively followed by Dr. Marcey Goodman, EP from [facility] patient has been doing reasonably well but started having recurrent palpitation symptoms had some generalized anxiety type symptoms recently had a URI treated with prednisone and doxycycline azithromycin over-the-counter medication which she discontinued over 48 hours ago no fever chills or nausea vomiting diarrhea came to emergency room and was thought to be atrial fibrillation rate of 170 with stable vital signs hemodynamically stable treated with fluids hydroxyzine  diltiazem  with no improvement in rate control cardiology was consulted for further evaluation  Review of systems complete and found to be negative unless listed above     Past Medical History:  Diagnosis Date   AF (atrial fibrillation) (HCC)    Single episode   Endometrioma    RIGHT OVARY   Endometriosis    Osteoporosis 08/28/2016   T score -3.0 lumbar spine, -2.1 left femoral neck    Past Surgical History:  Procedure Laterality Date   OOPHORECTOMY  2007   LAP RSO   PELVIC LAPAROSCOPY  2007   DX LAP, RSO , LASER VAPORIZATION OF ENDOMETRIOSIS    (Not in a hospital admission)  Social History   Socioeconomic History   Marital status: Married    Spouse name: Not on file   Number of children: Not on file   Years of education: Not on file   Highest education level: Not on file  Occupational History   Not on file  Tobacco Use   Smoking status: Never   Smokeless tobacco: Never  Vaping Use   Vaping status: Never Used  Substance and  Sexual Activity   Alcohol use: No    Alcohol/week: 0.0 standard drinks of alcohol   Drug use: No   Sexual activity: Yes    Birth control/protection: Post-menopausal    Comment: 1st intercourse 59 yo-Fewer than 5 partners  Other Topics Concern   Not on file  Social History Narrative   Not on file   Social Drivers of Health   Tobacco Use: Low Risk  (03/29/2024)   Received from [facility]   Patient History    Smoking Tobacco Use: Never    Smokeless Tobacco Use: Never    Passive Exposure: Not on file  Financial Resource Strain: Not on file  Food Insecurity: Not on file  Transportation Needs: Not on file  Physical Activity: Not on file  Stress: Not on file  Social Connections: Not on file  Intimate Partner Violence: Not on file  Depression (PHQ2-9): Low Risk (06/29/2023)   Depression (PHQ2-9)    PHQ-2 Score: 0  Alcohol Screen: Not on file  Housing: Unknown (05/05/2023)   Received from [facility]   Epic    Unable to Pay for Housing in the Last Year: Not on file    Number of Times Moved in the Last Year: Not on file    At any time in the past 12 months, were you homeless or living in a shelter (including now)?: No  Utilities: Not on file  Health Literacy: Not on file    Family History  Problem Relation Age of Onset   Hypertension Mother    Atrial fibrillation Mother    Osteoporosis Mother    Deep vein thrombosis Brother    Heart disease Maternal Uncle    Hypertension Maternal Grandmother    Cancer Maternal Grandmother        BLADDER   Osteoporosis Maternal Grandmother    Atrial fibrillation Maternal Grandmother    Cancer Paternal Grandmother 21       stomach - tumor   Heart disease Maternal Grandfather    Stroke Maternal Grandfather    Heart disease Paternal Grandfather       Review of systems complete and found to be negative unless listed above      PHYSICAL EXAM  General: Well developed, well nourished, in no acute  distress HEENT:  Normocephalic and atramatic Neck:  No JVD.  Lungs: Clear bilaterally to auscultation and percussion. Heart: Tachycardic irregular. Normal S1 and S2 without gallops or murmurs.  Abdomen: Bowel sounds are positive, abdomen soft and non-tender  Msk:  Back normal, normal gait. Normal strength and tone for age. Extremities: No clubbing, cyanosis or edema.   Neuro: Alert and oriented X 3. Psych:  Good affect, responds appropriately  Labs:   Lab Results  Component Value Date   WBC 5.3 04/05/2024   HGB 13.5 04/05/2024   HCT 42.3 04/05/2024   MCV 93.2 04/05/2024   PLT 346 04/05/2024    Recent Labs  Lab 04/05/24 0939  NA 142  K 3.6  CL 104  CO2 23  BUN 25*  CREATININE 0.68  CALCIUM 10.8*  PROT 7.5  BILITOT 0.5  ALKPHOS 60  ALT 13  AST 21  GLUCOSE 134*   Lab Results  Component Value Date   TROPONINI <0.03 03/04/2018    Lab Results  Component Value Date   CHOL 209 (A) 08/19/2015   Lab Results  Component Value Date   HDL 96 (A) 08/19/2015   Lab Results  Component Value Date   LDLCALC 102 08/19/2015   Lab Results  Component Value Date   TRIG 56 08/19/2015   No results found for: CHOLHDL No results found for: LDLDIRECT    Radiology: DG Chest Portable 1 View Result Date: 04/05/2024 CLINICAL DATA:  Palpitations, shortness of breath EXAM: PORTABLE CHEST 1 VIEW COMPARISON:  None Available. FINDINGS: The heart size and mediastinal contours are within normal limits. Both lungs are clear. The visualized skeletal structures are unremarkable. IMPRESSION: No active disease. Electronically Signed   By: Lynwood Landy Raddle M.D.   On: 04/05/2024 10:20    EKG: Atrial fibrillation rapid ventricular response rate of 140 narrow complex  ASSESSMENT AND PLAN:  Paroxysmal atrial fibrillation with RVR Palpitations Tachycardia Anxiety  Plan Agree with admit for observation to telemetry follow-up EKGs follow-up troponins Echocardiogram for assessment of  ventricular function valvular structures Discontinue Cardizem  IV and switch to metoprolol  Will initially give metoprolol  IV but transition to p.o. for rate control Will institute single dose flecainide  to help with conversion consider recurrent dose of flecainide  but will refer to EP for further assessment Short-term anticoagulation from heparin  to Eliquis  until evaluated by EP Will strongly consider either pill in the pocket with flecainide  and Eliquis  or ablation with EP No indication at this point for ischemic workup Very low CHA2DS2-VASc score so long-term anticoagulation may not be indicated  Signed: Cara JONETTA Lovelace MD 04/05/2024, 11:57 PM      "

## 2024-04-05 NOTE — ED Provider Notes (Signed)
 "  [facility] Provider Note    Event Date/Time   First MD Initiated Contact with Patient 04/05/24 0940     (approximate)   History   Palpitations  Pt via POV from [facility]. Pt c/o palpitations this AM, reports some SOB because she is anxious. Denies pain. Reports that she has been sick and been put on prednisone and Zpack. Pt has a hx of a fib. Pt reports she was given diltiazem  only PRN when she feels palpitations. Pt is A&OX4 and NAD but is very nervous.    HPI [name] is a 59 y.o. female PMH A-fib not on anticoagulation, reactive airway disease, acid reflux presents for evaluation of palpitations - Patient developed acute onset palpitations this morning around 7 AM - No preceding chest pain.  No shortness of breath.  No she does feel very anxious. - Has been prescribed diltiazem  in the past though this is on a as needed basis.  Tells me she has only had about 2 episodes of atrial fibrillation in the past and they were likely related to fasting and some hypokalemia at that time. - Is recently being treated for possible sinusitis and bronchitis.  Recently prescribed azithromycin and now on doxycycline.  Was started on steroids Saturday though apparently these have made her quite jittery in the past, did not take dose yesterday due to this.  No fevers. - No recent surgery/stasis/travel, no history of DVT/PE, no hormone use, no pleuritic discomfort  Per chart review, patient was evaluated by her primary care provider last month for episode of palpitations.  Is on diltiazem .  Not on anticoagulation.  Reportedly had recent Holter monitor which was unremarkable.     Physical Exam   Triage Vital Signs: ED Triage Vitals  Encounter Vitals Group     BP --      Girls Systolic BP Percentile --      Girls Diastolic BP Percentile --      Boys Systolic BP Percentile --      Boys Diastolic BP Percentile --      Pulse Rate 04/05/24 0939 (!) 170     Resp 04/05/24 0939 18      Temp 04/05/24 0939 97.6 F (36.4 C)     Temp Source 04/05/24 0939 Oral     SpO2 04/05/24 0939 100 %     Weight 04/05/24 0935 149 lb (67.6 kg)     Height 04/05/24 0935 5' 7 (1.702 m)     Head Circumference --      Peak Flow --      Pain Score 04/05/24 0935 0     Pain Loc --      Pain Education --      Exclude from Growth Chart --     Most recent vital signs: Vitals:   04/05/24 1430 04/05/24 1500  BP: 111/76 97/85  Pulse: 92 (!) 101  Resp: 15 11  Temp:    SpO2: 100% 100%     General: Awake, no distress.  CV:  Good peripheral perfusion.  Tachycardic, regular rhythm, RP 2+ Resp:  Normal effort. CTAB Abd:  No distention. Nontender to deep palpation throughout Other:  No leg swelling   ED Results / Procedures / Treatments   Labs (all labs ordered are listed, but only abnormal results are displayed) Labs Reviewed  COMPREHENSIVE METABOLIC PANEL WITH GFR - Abnormal; Notable for the following components:      Result Value   Glucose, Bld 134 (*)  BUN 25 (*)    Calcium 10.8 (*)    All other components within normal limits  PRO BRAIN NATRIURETIC PEPTIDE  PROTIME-INR  CBC WITH DIFFERENTIAL/PLATELET  D-DIMER, QUANTITATIVE (NOT AT [facility])  TSH  TROPONIN T, HIGH SENSITIVITY  TROPONIN T, HIGH SENSITIVITY     EKG  A-fib RVR, diffuse ST depressions which I suspect are rate dependent, see ED course below   RADIOLOGY Radiology interpreted by myself radiology report reviewed.  No acute pathology identified.    PROCEDURES:  Critical Care performed: Yes, see critical care procedure note(s)  .Critical Care  Performed by: Clarine Ozell LABOR, MD Authorized by: Clarine Ozell LABOR, MD   Critical care provider statement:    Critical care time (minutes):  30   Critical care time was exclusive of:  Separately billable procedures and treating other patients   Critical care was necessary to treat or prevent imminent or life-threatening deterioration of the following conditions:   Cardiac failure and circulatory failure   Critical care was time spent personally by me on the following activities:  Development of treatment plan with patient or surrogate, discussions with consultants, evaluation of patient's response to treatment, examination of patient, ordering and review of laboratory studies, ordering and review of radiographic studies, ordering and performing treatments and interventions, pulse oximetry, re-evaluation of patient's condition and review of old charts   I assumed direction of critical care for this patient from another provider in my specialty: no     Care discussed with: admitting provider      MEDICATIONS ORDERED IN ED: Medications  diltiazem  (CARDIZEM ) 125 mg in dextrose  5% 125 mL (1 mg/mL) infusion (5 mg/hr Intravenous New Bag/Given 04/05/24 1409)  sodium chloride  0.9 % bolus 1,000 mL (0 mLs Intravenous Stopped 04/05/24 1145)  diltiazem  (CARDIZEM ) injection 10 mg (10 mg Intravenous Given 04/05/24 1025)  diltiazem  (CARDIZEM ) injection 10 mg (10 mg Intravenous Given 04/05/24 1111)  hydrOXYzine  (ATARAX ) tablet 10 mg (10 mg Oral Given 04/05/24 1412)     IMPRESSION / MDM / ASSESSMENT AND PLAN / ED COURSE  I reviewed the triage vital signs and the nursing notes.                              DDX/MDM/AP: Differential diagnosis includes, but is not limited to, primary arrhythmia, consider underlying electrolyte abnormality, anemia.  Considered but doubt underlying infection.  Consider underlying dehydration.  No risk factors for PE.  Plan: - Labs - Chest x-ray - EKG - IV fluid - Diltiazem  - Reassess  Patient's presentation is most consistent with acute presentation with potential threat to life or bodily function.  The patient is on the cardiac monitor to evaluate for evidence of arrhythmia and/or significant heart rate changes.  ED course below.  Workup overall unremarkable except for elevated BUN, received 1 L IV fluid for likely mild dehydration.   Did respond well to an initial dose of diltiazem  with a heart rate improving from the 160s-190s to the 130s-140s though unfortunately this is persistent after a second round of IV diltiazem .  No evidence of clear underlying pathology contributing to presentation today though is requiring more aggressive heart rate management, escalated to diltiazem  drip.  Did treat with hydroxyzine  for anxiety as well.  Blood pressure remained stable.  Admitted to hospitalist service for A-fib RVR.  Clinical Course as of 04/05/24 1532  Wed Apr 05, 2024  9040 Ecg = atrial fibrillation, rate of 172, trace  ST depressions throughout which I suspect are rate related, no gross ST elevation.  Otherwise normal intervals.  Abnormal EKG. [MM]  1024 CXR: IMPRESSION: No active disease.   [MM]  1045 BUN mildly elevated, hemoglobin within baseline range.  No electrolyte abnormalities, no anemia, no leukocytosis.  Consider dehydration contributing. [MM]  1045 BNP, troponin normal [MM]  1055 Pt re-evaluated, HR improved to 130s-140s. Appears to intermittently going to NSR and converting back to afib. Notably improved HR from 160s-190s on presentation. IVF in process. Will give repeat dose of diltiazem .  [MM]  1303 Patient's heart rate about 110.  Appears to be in sinus rhythm.  Will get repeat EKG and load with oral diltiazem . [MM]  1303 Spoke with lab regarding delay in D-dimer, note first test was normal but there was a labeling issue, repeating now and will publish results shortly [MM]  1320 Dimer wnl [MM]  1333 Patient reevaluated, heart rate has unfortunately returned to the 140s, appears to remain in A-fib.  Normotensive.  Does endorse some ongoing anxiety and is now amenable to anxiolytic, will give dose of hydroxyzine .  Given previously lower heart rate I was initially planning for an oral load of her home diltiazem  dose though given that her tachycardia has now worsened I will start her on a diltiazem  drip and plan for  admission.  No evidence of clear underlying acute pathology at this time.  Hospitalist consult order placed [MM]  1416 Rpt ecg = A-fib, rate 138, no gross ST elevation or depression, no significant repolarization abnormality.  Nuclear evidence of ischemia no arrhythmia interpretation. [MM]    Clinical Course User Index [MM] Clarine Ozell LABOR, MD     FINAL CLINICAL IMPRESSION(S) / ED DIAGNOSES   Final diagnoses:  Atrial fibrillation with rapid ventricular response (HCC)     Rx / DC Orders   ED Discharge Orders     None        Note:  This document was prepared using Dragon voice recognition software and may include unintentional dictation errors.   Clarine Ozell LABOR, MD 04/05/24 [phone]  "

## 2024-04-05 NOTE — Consult Note (Addendum)
 Pharmacy Consult Note - Anticoagulation  Pharmacy Consult for heparin  infusion Indication: atrial fibrillation Allergies[1]  PATIENT MEASUREMENTS: Height: 5' 7 (170.2 cm) Weight: 67.6 kg (149 lb) IBW/kg (Calculated) : 61.6 HEPARIN  DW (KG): 67.6  VITAL SIGNS: Temp: 98.1 F (36.7 C) (01/21 1901) Temp Source: Oral (01/21 1901) BP: 91/69 (01/21 1901) Pulse Rate: 75 (01/21 1901)  Recent Labs    04/05/24 0939  HGB 13.5  HCT 42.3  PLT 346  LABPROT 13.5  INR 1.0  CREATININE 0.68    Estimated Creatinine Clearance: 72.7 mL/min (by C-G formula based on SCr of 0.68 mg/dL).  PAST MEDICAL HISTORY: Past Medical History:  Diagnosis Date   AF (atrial fibrillation) (HCC)    Single episode   Endometrioma    RIGHT OVARY   Endometriosis    Osteoporosis 08/28/2016   T score -3.0 lumbar spine, -2.1 left femoral neck    Medications:  (Not in a hospital admission)  Scheduled:   heparin   3,400 Units Intravenous Once   Infusions:   diltiazem  (CARDIZEM ) infusion 5 mg/hr (04/05/24 1409)   heparin      lactated ringers      lactated ringers      PRN: acetaminophen  **OR** acetaminophen , ALPRAZolam , ondansetron  **OR** ondansetron  (ZOFRAN ) IV Anti-infectives (From admission, onward)    None       ASSESSMENT: 59 y.o. female with PMH paroxsymal a.fib is presenting with palpitations. Patient is not on chronic anticoagulation per chart review. Pharmacy has been consulted to initiate and manage heparin  intravenous infusion.   Goal(s) of therapy: Heparin  level 0.3 - 0.7 units/mL aPTT 66 - 102 seconds Monitor platelets by anticoagulation protocol: Yes   Baseline anticoagulation labs: Recent Labs    04/05/24 0939  INR 1.0  HGB 13.5  PLT 346    Baseline aPTT pending   PLAN:  Give 3,400 units bolus x1; then start heparin  infusion at 950 units/hour.  Check heparin  level in 6 hours, then daily once at least two levels are consecutively therapeutic.  Monitor CBC daily while on  heparin  infusion.   Annabella LOISE Banks, PharmD Clinical Pharmacist 04/05/2024 7:05 PM      [1]  Allergies Allergen Reactions   Levaquin [Levofloxacin In D5w] Anaphylaxis    Suicidal thoughts   Cefdinir Diarrhea    Other Reaction(s): abdominal pain   Tobramycin Itching

## 2024-04-05 NOTE — ED Triage Notes (Signed)
 Pt via POV from [facility]. Pt c/o palpitations this AM, reports some SOB because she is anxious. Denies pain. Reports that she has been sick and been put on prednisone and Zpack. Pt has a hx of a fib. Pt reports she was given diltiazem  only PRN when she feels palpitations. Pt is A&OX4 and NAD but is very nervous.

## 2024-04-05 NOTE — ED Notes (Signed)
 Called CCMD to add pt to monitoring.

## 2024-04-05 NOTE — ED Notes (Signed)
 Called lab about blood that was sent down while pt was in triage. Lab stated that they have the blood and will get it received.

## 2024-04-05 NOTE — ED Notes (Signed)
 Pt arrives from [facility] with c/o tachycardia with a Hx of Afib and was converted the last time in 2021. Pt states that the times she was in AFIB before was because they were fasting and their potassium was low. Per support people in the office pt has been on prednisone, and abx recently. Pt has a prescription for dilt but did not take it this morning because they weren't sure why they were having the fast heart rate. Pt reports anxiety with the fast heart rate. Pt is A&Ox4 during triage.

## 2024-04-05 NOTE — Progress Notes (Signed)
 Brief cardiology consult note Hx Afib-P  RVR Palpitations  Tachycardia   Plan D/c cardizem  Heparin  IV->Eliquis   Metoprolol  IV / PO Flecanide 100 mg po bid Heart rate goal is under 100 at rest.

## 2024-04-05 NOTE — H&P (Signed)
 " History and Physical    Patient: [name] FMW:[id number] DOB: 22-Aug-1965 DOA: 04/05/2024 DOS: the patient was seen and examined on 04/05/2024 PCP: Auston Reyes BIRCH, MD  Patient coming from: Home  Chief Complaint:  Chief Complaint  Patient presents with   Palpitations   HPI: [name] is a 59 y.o. female with medical history significant of paroxysmal atrial fibrillation presents to the emergency department for palpitations that started this morning. She was originally diagnosed in 2020 with atrial fibrillation in the setting of hypokalemia, but has for the most part remained in sinus rhythm without medications.  She has as needed diltiazem  which she has only taken a few times throughout the years.  This morning she reports sudden onset of palpitations without chest pain, dizziness, or SOB. She recently had a URI and was taking prednisone, doxycycline, azithromycin, and some over the counter medications which she stopped 2 days ago. She reports ongoing cough. No fevers, n/v/d, headache. Also admits to increased stress at work, difficulty sleeping, and increased anxiety.   In the emergency department, she was in atrial fibrillation with RVR with rates up to 170.  Blood pressure has remained stable.  Workup so far unremarkable.  She was given a liter of fluid, hydroxyzine ,  and 20 mg of IV diltiazem  with inadequate rate control, although palpitations are resolved.   Hospitalist was consulted for admission.  I discussed case briefly with on call Cardiology, recommended anticoagulation, wean off diltiazem  gtt, flecanide BID, and metoprolol .     Review of Systems: Review of Systems  Constitutional:  Negative for chills, fever and weight loss.  Eyes:  Positive for double vision. Negative for blurred vision.  Respiratory:  Positive for cough. Negative for hemoptysis, sputum production, shortness of breath and wheezing.   Cardiovascular:  Positive for palpitations. Negative for chest pain,  orthopnea and claudication.  Gastrointestinal:  Negative for abdominal pain, constipation, diarrhea, heartburn, nausea and vomiting.  Genitourinary:  Negative for dysuria, frequency, hematuria and urgency.  Musculoskeletal:  Negative for back pain.  Skin:  Negative for itching and rash.  Neurological:  Negative for dizziness, tingling, tremors, sensory change, speech change, focal weakness, seizures, loss of consciousness and weakness.  Psychiatric/Behavioral:  The patient is nervous/anxious.     Past Medical History:  Diagnosis Date   AF (atrial fibrillation) (HCC)    Single episode   Endometrioma    RIGHT OVARY   Endometriosis    Osteoporosis 08/28/2016   T score -3.0 lumbar spine, -2.1 left femoral neck   Past Surgical History:  Procedure Laterality Date   OOPHORECTOMY  2007   LAP RSO   PELVIC LAPAROSCOPY  2007   DX LAP, RSO , LASER VAPORIZATION OF ENDOMETRIOSIS   Social History:  reports that she has never smoked. She has never used smokeless tobacco. She reports that she does not drink alcohol and does not use drugs.  Allergies[1]  Family History  Problem Relation Age of Onset   Hypertension Mother    Atrial fibrillation Mother    Osteoporosis Mother    Deep vein thrombosis Brother    Heart disease Maternal Uncle    Hypertension Maternal Grandmother    Cancer Maternal Grandmother        BLADDER   Osteoporosis Maternal Grandmother    Atrial fibrillation Maternal Grandmother    Cancer Paternal Grandmother 26       stomach - tumor   Heart disease Maternal Grandfather    Stroke Maternal Grandfather  Heart disease Paternal Grandfather     Prior to Admission medications  Medication Sig Start Date End Date Taking? Authorizing Provider  alendronate (FOSAMAX) 70 MG tablet One tab every Sunday on empty stomach with a full glass of water. Do not lie down or eat/drink anything else for the next 30 min. 10/05/23  Yes [provider]  Ascorbic Acid (VITAMIN C PO)  Take by mouth.   Yes [provider]  Calcium Carbonate-Vitamin D  (CALCIUM + D PO) Take by mouth.   Yes [provider]  Cholecalciferol (VITAMIN D  PO) Take by mouth.     Yes [provider]  diltiazem  (CARDIZEM ) 30 MG tablet Take 1 tablet (30 mg) by mouth every 6 hours as needed for fast heart rates 11/23/22  Yes Fernande Elspeth BROCKS, MD  IBUPROFEN PO Take by mouth.     Yes [provider]  MAGNESIUM PO Take by mouth daily.   Yes [provider]  Multiple Vitamins-Minerals (ONE-A-DAY VITACRAVES IMMUNITY PO) Take by mouth daily. Immunity vitamin   Yes [provider]  NON FORMULARY Immunity Support   Yes [provider]  predniSONE (DELTASONE) 10 MG tablet Taper 6-6-4-4-2-2-1-1-off 03/29/24  Yes [provider]  doxycycline (VIBRA-TABS) 100 MG tablet Take 100 mg by mouth 2 (two) times daily. For 10 days. Take with food Patient not taking: Reported on 04/05/2024 03/29/24 04/08/24  [provider]  fluticasone (FLONASE) 50 MCG/ACT nasal spray Place into the nose. Patient not taking: Reported on 04/05/2024 06/01/23   [provider]  meloxicam (MOBIC) 7.5 MG tablet Take 1 tablet twice a day by oral route with meal(s). Patient not taking: Reported on 06/29/2023 05/11/23   [provider]    Physical Exam: Vitals:   04/05/24 1400 04/05/24 1412 04/05/24 1430 04/05/24 1500  BP: 119/78  111/76 97/85  Pulse: (!) 102  92 (!) 101  Resp: (!) 22  15 11   Temp:  98.4 F (36.9 C)    TempSrc:  Oral    SpO2: 100%  100% 100%  Weight:      Height:       Physical Exam Constitutional:      General: She is not in acute distress.    Appearance: She is not toxic-appearing or diaphoretic.  HENT:     Head: Normocephalic.     Mouth/Throat:     Mouth: Mucous membranes are moist.  Cardiovascular:     Rate and Rhythm: Tachycardia present. Rhythm irregular.  Pulmonary:     Effort: Pulmonary effort is normal.     Breath  sounds: Normal breath sounds.  Abdominal:     General: Abdomen is flat. Bowel sounds are normal.     Palpations: Abdomen is soft.  Musculoskeletal:        General: No swelling. Normal range of motion.     Cervical back: Normal range of motion and neck supple.     Right lower leg: No edema.     Left lower leg: No edema.  Skin:    General: Skin is warm and dry.     Capillary Refill: Capillary refill takes less than 2 seconds.  Neurological:     General: No focal deficit present.     Mental Status: She is alert and oriented to person, place, and time. Mental status is at baseline.  Psychiatric:        Attention and Perception: Attention normal.        Mood and Affect: Mood is anxious.  Behavior: Behavior is cooperative.        Cognition and Memory: Cognition normal.     Data Reviewed:   Labs on Admission: I have personally reviewed following labs and imaging studies  CBC: Recent Labs  Lab 04/05/24 0939  WBC 5.3  NEUTROABS 3.1  HGB 13.5  HCT 42.3  MCV 93.2  PLT 346   Basic Metabolic Panel: Recent Labs  Lab 04/05/24 0939  NA 142  K 3.6  CL 104  CO2 23  GLUCOSE 134*  BUN 25*  CREATININE 0.68  CALCIUM 10.8*   GFR: Estimated Creatinine Clearance: 72.7 mL/min (by C-G formula based on SCr of 0.68 mg/dL). Liver Function Tests: Recent Labs  Lab 04/05/24 0939  AST 21  ALT 13  ALKPHOS 60  BILITOT 0.5  PROT 7.5  ALBUMIN 4.1   No results for input(s): LIPASE, AMYLASE in the last 168 hours. No results for input(s): AMMONIA in the last 168 hours. Coagulation Profile: Recent Labs  Lab 04/05/24 0939  INR 1.0   Cardiac Enzymes: No results for input(s): CKTOTAL, CKMB, CKMBINDEX, TROPONINI in the last 168 hours. BNP (last 3 results) Recent Labs    04/05/24 0939  PROBNP 191.0   HbA1C: No results for input(s): HGBA1C in the last 72 hours. CBG: No results for input(s): GLUCAP in the last 168 hours. Lipid Profile: No results for  input(s): CHOL, HDL, LDLCALC, TRIG, CHOLHDL, LDLDIRECT in the last 72 hours. Thyroid Function Tests: No results for input(s): TSH, T4TOTAL, FREET4, T3FREE, THYROIDAB in the last 72 hours. Anemia Panel: No results for input(s): VITAMINB12, FOLATE, FERRITIN, TIBC, IRON, RETICCTPCT in the last 72 hours. Urine analysis:    Component Value Date/Time   COLORURINE YELLOW 09/25/2014 1506   APPEARANCEUR CLEAR 09/25/2014 1506   LABSPEC 1.024 09/25/2014 1506   PHURINE 5.5 09/25/2014 1506   GLUCOSEU NEG 09/25/2014 1506   HGBUR NEG 09/25/2014 1506   BILIRUBINUR NEG 09/25/2014 1506   KETONESUR NEG 09/25/2014 1506   PROTEINUR NEG 09/25/2014 1506   UROBILINOGEN 0.2 09/25/2014 1506   NITRITE NEG 09/25/2014 1506   LEUKOCYTESUR NEG 09/25/2014 1506    Radiological Exams on Admission: DG Chest Portable 1 View Result Date: 04/05/2024 CLINICAL DATA:  Palpitations, shortness of breath EXAM: PORTABLE CHEST 1 VIEW COMPARISON:  None Available. FINDINGS: The heart size and mediastinal contours are within normal limits. Both lungs are clear. The visualized skeletal structures are unremarkable. IMPRESSION: No active disease. Electronically Signed   By: Lynwood Landy Raddle M.D.   On: 04/05/2024 10:20       Assessment and Plan:   59 y/o F with hx paroxysmal afib, previously taking diltiazem  only as needed given low afib burden, not on a/c given CHA2DS2VASc 1. Previously a patient of Dr. Celine, waiting to establish with Dr. Cindie  Presented to the ED for evaluation of palpitations. Found to be in afib RVR with rates up to 170.  Hemodynamically stable. Started on diltiazem  gtt with inadequate rate control.  I discussed the case briefly with Dr. Florencio, recommendations appreciated. formal consult to follow.   Afib RVR  Paroxysmal atrial fibrillation - heparin  drip  - continue diltiazem  drip for now, Cardiology to start flecainide  and metoprolol  - ECHO  - continue IVF  -  check TSH, monitor/replace electrolytes, address anxiety (pt agreeable to PRN xanax )    Lovenox   LR@75  Reg diet Monitor/replace electrolytes   Advance Care Planning:   Code Status: Full Code    Consults: Cardiology  Family Communication: Husband present  at bedside   Severity of Illness: The appropriate patient status for this patient is INPATIENT. Inpatient status is judged to be reasonable and necessary in order to provide the required intensity of service to ensure the patient's safety. The patient's presenting symptoms, physical exam findings, and initial radiographic and laboratory data in the context of their chronic comorbidities is felt to place them at high risk for further clinical deterioration. Furthermore, it is not anticipated that the patient will be medically stable for discharge from the hospital within 2 midnights of admission.   * I certify that at the point of admission it is my clinical judgment that the patient will require inpatient hospital care spanning beyond 2 midnights from the point of admission due to high intensity of service, high risk for further deterioration and high frequency of surveillance required.*  Author: Daved JAYSON Pump, DO 04/05/2024 5:41 PM  For on call review [email or web address].      [1]  Allergies Allergen Reactions   Levaquin [Levofloxacin In D5w] Anaphylaxis    Suicidal thoughts   Cefdinir Diarrhea    Other Reaction(s): abdominal pain   Tobramycin Itching   "

## 2024-04-06 ENCOUNTER — Inpatient Hospital Stay
Admit: 2024-04-06 | Discharge: 2024-04-06 | Disposition: A | Discharge status: Home / Self Care | Attending: Emergency Medicine | Admitting: Emergency Medicine

## 2024-04-06 DIAGNOSIS — I480 Paroxysmal atrial fibrillation: Secondary | ICD-10-CM

## 2024-04-06 LAB — ECHOCARDIOGRAM COMPLETE
AR max vel: 2.89 cm2
AV Area VTI: 2.9 cm2
AV Area mean vel: 2.98 cm2
AV Mean grad: 3 mmHg
AV Peak grad: 6.2 mmHg
Ao pk vel: 1.24 m/s
Area-P 1/2: 4.46 cm2
Height: 67 in
MV VTI: 3.17 cm2
S' Lateral: 2.4 cm
Weight: 2384 [oz_av]

## 2024-04-06 LAB — CBC
HCT: 32.8 % — ABNORMAL LOW (ref 36.0–46.0)
Hemoglobin: 10.6 g/dL — ABNORMAL LOW (ref 12.0–15.0)
MCH: 30.7 pg (ref 26.0–34.0)
MCHC: 32.3 g/dL (ref 30.0–36.0)
MCV: 95.1 fL (ref 80.0–100.0)
Platelets: 240 K/uL (ref 150–400)
RBC: 3.45 MIL/uL — ABNORMAL LOW (ref 3.87–5.11)
RDW: 13.4 % (ref 11.5–15.5)
WBC: 5.9 K/uL (ref 4.0–10.5)
nRBC: 0 % (ref 0.0–0.2)

## 2024-04-06 LAB — HEPARIN LEVEL (UNFRACTIONATED): Heparin Unfractionated: 0.39 [IU]/mL (ref 0.30–0.70)

## 2024-04-06 LAB — HIV ANTIBODY (ROUTINE TESTING W REFLEX): HIV Screen 4th Generation wRfx: NONREACTIVE

## 2024-04-06 LAB — BASIC METABOLIC PANEL WITH GFR
Anion gap: 7 (ref 5–15)
BUN: 17 mg/dL (ref 6–20)
CO2: 22 mmol/L (ref 22–32)
Calcium: 7.6 mg/dL — ABNORMAL LOW (ref 8.9–10.3)
Chloride: 112 mmol/L — ABNORMAL HIGH (ref 98–111)
Creatinine, Ser: 0.54 mg/dL (ref 0.44–1.00)
GFR, Estimated: >60 mL/min
Glucose, Bld: 104 mg/dL — ABNORMAL HIGH (ref 70–99)
Potassium: 3.8 mmol/L (ref 3.5–5.1)
Sodium: 142 mmol/L (ref 135–145)

## 2024-04-06 LAB — APTT: aPTT: 79 s — ABNORMAL HIGH (ref 24–36)

## 2024-04-06 MED ORDER — SODIUM CHLORIDE 0.9 % IV BOLUS
1000.0000 mL | Freq: Once | INTRAVENOUS | Status: AC
  Administered 2024-04-06: 1000 mL via INTRAVENOUS

## 2024-04-06 MED ORDER — LACTATED RINGERS IV BOLUS
1000.0000 mL | Freq: Once | INTRAVENOUS | Status: AC
  Administered 2024-04-06: 1000 mL via INTRAVENOUS

## 2024-04-06 MED ORDER — FLECAINIDE ACETATE 100 MG PO TABS: 100.0000 mg | ORAL_TABLET | Freq: Two times a day (BID) | ORAL | 1 refills | Status: AC | PRN

## 2024-04-06 MED ORDER — APIXABAN 5 MG PO TABS: 5.0000 mg | ORAL_TABLET | Freq: Two times a day (BID) | ORAL | Status: DC

## 2024-04-06 NOTE — Consult Note (Signed)
 Pharmacy Consult Note - Anticoagulation  Pharmacy Consult for heparin  infusion Indication: atrial fibrillation Allergies[1]  PATIENT MEASUREMENTS: Height: 5' 7 (170.2 cm) Weight: 67.6 kg (149 lb) IBW/kg (Calculated) : 61.6 HEPARIN  DW (KG): 67.6  VITAL SIGNS: Temp: 97.9 F (36.6 C) (01/21 2316) Temp Source: Oral (01/21 2316) BP: 77/54 (01/22 0100) Pulse Rate: 56 (01/22 0100)  Recent Labs    04/05/24 0939 04/06/24 0137  HGB 13.5 10.6*  HCT 42.3 32.8*  PLT 346 240  APTT  --  79*  LABPROT 13.5  --   INR 1.0  --   CREATININE 0.68  --     Estimated Creatinine Clearance: 72.7 mL/min (by C-G formula based on SCr of 0.68 mg/dL).  PAST MEDICAL HISTORY: Past Medical History:  Diagnosis Date   AF (atrial fibrillation) (HCC)    Single episode   Endometrioma    RIGHT OVARY   Endometriosis    Osteoporosis 08/28/2016   T score -3.0 lumbar spine, -2.1 left femoral neck    Medications:  (Not in a hospital admission)  Scheduled:   metoprolol  tartrate  25 mg Oral TID   Infusions:   heparin  950 Units/hr (04/05/24 2012)   lactated ringers  75 mL/hr at 04/06/24 0237   PRN: acetaminophen  **OR** acetaminophen , ALPRAZolam , metoprolol  tartrate, ondansetron  **OR** ondansetron  (ZOFRAN ) IV Anti-infectives (From admission, onward)    None       ASSESSMENT: 59 y.o. female with PMH paroxsymal a.fib is presenting with palpitations. Patient is not on chronic anticoagulation per chart review. Pharmacy has been consulted to initiate and manage heparin  intravenous infusion.   Goal(s) of therapy: Heparin  level 0.3 - 0.7 units/mL aPTT 66 - 102 seconds Monitor platelets by anticoagulation protocol: Yes   Baseline anticoagulation labs: Recent Labs    04/05/24 0939 04/06/24 0137  APTT  --  79*  INR 1.0  --   HGB 13.5 10.6*  PLT 346 240    Baseline aPTT pending  01/22 0137 HL 0.39, therapeutic x 1 / aPTT 79  PLAN: Continue heparin  infusion at 950 units/hour. Recheck HL in  6 hours to confirm, then daily once at least two levels are consecutively therapeutic. Monitor CBC daily while on heparin  infusion.  Rankin CANDIE Dills, PharmD, [facility] 04/06/2024 3:09 AM       [1]  Allergies Allergen Reactions   Levaquin [Levofloxacin In D5w] Anaphylaxis    Suicidal thoughts   Cefdinir Diarrhea    Other Reaction(s): abdominal pain   Tobramycin Itching

## 2024-04-06 NOTE — Discharge Summary (Signed)
 " Physician Discharge Summary   Patient: [name] MRN: [id number] DOB: 07/17/1965  Admit date:     04/05/2024  Discharge date: 04/06/24  Discharge Physician: Carliss LELON Canales   PCP: Auston Reyes BIRCH, MD   Recommendations at discharge:    Pt to be discharged home.   If you experience worsening fever, chills, chest pain, shortness of breath, or other concerning symptoms, please call your PCP or go to the emergency department immediately.  Discharge Diagnoses: Principal Problem:   A-fib [facility])  Resolved Problems:   * No resolved hospital problems. *   Hospital Course:  59 y.o. female with medical history significant of paroxysmal atrial fibrillation presents to the emergency department for palpitations that started this morning. She was originally diagnosed in 2020 with atrial fibrillation in the setting of hypokalemia, but has for the most part remained in sinus rhythm without medications.  She has as needed diltiazem  which she has only taken a few times throughout the years.   This morning she reports sudden onset of palpitations without chest pain, dizziness, or SOB. She recently had a URI and was taking prednisone, doxycycline, azithromycin, and some over the counter medications which she stopped 2 days ago. She reports ongoing cough. No fevers, n/v/d, headache. Also admits to increased stress at work, difficulty sleeping, and increased anxiety.    In the emergency department, she was in atrial fibrillation with RVR with rates up to 170.  Blood pressure has remained stable.  Workup so far unremarkable.  She was given a liter of fluid, hydroxyzine ,  and 20 mg of IV diltiazem  with inadequate rate control, although palpitations are resolved.   Hospitalist was consulted for admission.   I discussed case briefly with on call Cardiology, recommended anticoagulation, wean off diltiazem  gtt, flecanide BID, and metoprolol .   Assessment and Plan:  Atrial fibrillation with rapid ventricular  response - Evaluated by cardiology.  Initially placed on Cardizem  drip.  Subsequently transition to metoprolol  IV with improvement in rate control.  Instituted flecainide  to help with conversion.  Showed marked improvement in rate, transition back to NSR.  Per recommendations from cardiology, will provide prescription for flecainide  100 mg twice daily as needed to use as well as continue her as needed Cardizem .  Will follow-up with cardiology in 1 week.   Consultants: Cardiology Procedures performed: None Disposition: Home Diet recommendation:  Regular diet  DISCHARGE MEDICATION: Allergies as of 04/06/2024       Reactions   Levaquin [levofloxacin In D5w] Anaphylaxis   Suicidal thoughts   Cefdinir Diarrhea   Other Reaction(s): abdominal pain   Tobramycin Itching        Medication List     STOP taking these medications    doxycycline 100 MG tablet Commonly known as: VIBRA-TABS   fluticasone 50 MCG/ACT nasal spray Commonly known as: FLONASE   meloxicam 7.5 MG tablet Commonly known as: MOBIC   predniSONE 10 MG tablet Commonly known as: DELTASONE       TAKE these medications    alendronate 70 MG tablet Commonly known as: FOSAMAX One tab every Sunday on empty stomach with a full glass of water. Do not lie down or eat/drink anything else for the next 30 min.   CALCIUM + D PO Take by mouth.   diltiazem  30 MG tablet Commonly known as: CARDIZEM  Take 1 tablet (30 mg) by mouth every 6 hours as needed for fast heart rates   flecainide  100 MG tablet Commonly known as: TAMBOCOR  Take 1  tablet (100 mg total) by mouth 2 (two) times daily as needed (atrial fibrillation with rvr).   IBUPROFEN PO Take by mouth.   MAGNESIUM PO Take by mouth daily.   NON FORMULARY Immunity Support   ONE-A-DAY VITACRAVES IMMUNITY PO Take by mouth daily. Immunity vitamin   VITAMIN C PO Take by mouth.   VITAMIN D  PO Take by mouth.        Follow-up Information     Florencio Cara BIRCH, MD. Go in 1 week(s).   Specialties: Cardiology, Internal Medicine Why: Appointment scheduled for Wednesday 04/12/24 at 8:45 AM Contact information: [address] [place] [address] [phone]                 Discharge Exam: Fredricka Weights   04/05/24 0935  Weight: 67.6 kg    GENERAL:  Alert, pleasant, no acute distress  HEENT:  EOMI CARDIOVASCULAR:  RRR, no murmurs appreciated RESPIRATORY:  Clear to auscultation, no wheezing, rales, or rhonchi GASTROINTESTINAL:  Soft, nontender, nondistended EXTREMITIES:  No LE edema bilaterally NEURO:  No new focal deficits appreciated SKIN:  No rashes noted PSYCH:  Appropriate mood and affect     Condition at discharge: good  The results of significant diagnostics from this hospitalization (including imaging, microbiology, ancillary and laboratory) are listed below for reference.   Imaging Studies: ECHOCARDIOGRAM COMPLETE Result Date: 04/06/2024    ECHOCARDIOGRAM REPORT   Patient Name:   [name] Date of Exam: 04/06/2024 Medical Rec #:  [id number]    Height:       67.0 in Accession #:    [id number]   Weight:       149.0 lb Date of Birth:  1965-10-15    BSA:          1.784 m Patient Age:    59 years     BP:           81/55 mmHg Patient Gender: F            HR:           57 bpm. Exam Location:  [facility] Procedure: 2D Echo, Cardiac Doppler, Color Doppler, 3D Echo and Strain Analysis            (Both Spectral and Color Flow Doppler were utilized during            procedure). Indications:     Atrial Fibrillation I48.91  History:         Patient has no prior history of Echocardiogram examinations.                  Arrythmias:Atrial Fibrillation.  Sonographer:     Christopher Furnace Referring Phys:  [id number] DAVED JAYSON [facility] Diagnosing Phys: Dwayne D Callwood MD  Sonographer Comments: Global longitudinal strain was attempted. IMPRESSIONS  1. Left ventricular ejection fraction, by estimation, is 65 to 70%. The left ventricle has normal  function. The left ventricle has no regional wall motion abnormalities. Left ventricular diastolic parameters were normal. The average left ventricular global longitudinal strain is 16.4 %. The global longitudinal strain is normal.  2. Right ventricular systolic function is normal. The right ventricular size is normal.  3. The mitral valve is grossly normal. Trivial mitral valve regurgitation.  4. The aortic valve is normal in structure. Aortic valve regurgitation is not visualized. FINDINGS  Left Ventricle: Left ventricular ejection fraction, by estimation, is 65 to 70%. The left ventricle has normal function. The left ventricle has no regional wall  motion abnormalities. The average left ventricular global longitudinal strain is 16.4 %. Strain was performed and the global longitudinal strain is normal. The left ventricular internal cavity size was normal in size. There is no left ventricular hypertrophy. Left ventricular diastolic parameters were normal. Right Ventricle: The right ventricular size is normal. No increase in right ventricular wall thickness. Right ventricular systolic function is normal. Left Atrium: Left atrial size was normal in size. Right Atrium: Right atrial size was normal in size. Pericardium: There is no evidence of pericardial effusion. Mitral Valve: The mitral valve is grossly normal. Trivial mitral valve regurgitation. MV peak gradient, 5.0 mmHg. The mean mitral valve gradient is 2.0 mmHg. Tricuspid Valve: The tricuspid valve is normal in structure. Tricuspid valve regurgitation is trivial. Aortic Valve: The aortic valve is normal in structure. Aortic valve regurgitation is not visualized. Aortic valve mean gradient measures 3.0 mmHg. Aortic valve peak gradient measures 6.2 mmHg. Aortic valve area, by VTI measures 2.90 cm. Pulmonic Valve: The pulmonic valve was not well visualized. Pulmonic valve regurgitation is not visualized. Aorta: The aortic root was not well visualized. IAS/Shunts:  No atrial level shunt detected by color flow Doppler. Additional Comments: 3D was performed not requiring image post processing on an independent workstation and was normal.  LEFT VENTRICLE PLAX 2D LVIDd:         3.80 cm   Diastology LVIDs:         2.40 cm   LV e' medial:    9.57 cm/s LV PW:         0.90 cm   LV E/e' medial:  8.8 LV IVS:        0.90 cm   LV e' lateral:   12.40 cm/s LVOT diam:     2.00 cm   LV E/e' lateral: 6.8 LV SV:         77 LV SV Index:   43        2D Longitudinal Strain LVOT Area:     3.14 cm  2D Strain GLS (A4C):   19.3 % LV IVRT:       79 msec   2D Strain GLS (A3C):   8.8 %                          2D Strain GLS (A2C):   21.0 %                          2D Strain GLS Avg:     16.4 % RIGHT VENTRICLE RV Basal diam:  3.00 cm RV Mid diam:    2.20 cm TAPSE (M-mode): 1.4 cm LEFT ATRIUM           Index        RIGHT ATRIUM          Index LA diam:      2.90 cm 1.63 cm/m   RA Area:     9.18 cm LA Vol (A2C): 57.4 ml 32.17 ml/m  RA Volume:   20.20 ml 11.32 ml/m LA Vol (A4C): 26.9 ml 15.07 ml/m  AORTIC VALVE AV Area (Vmax):    2.89 cm AV Area (Vmean):   2.98 cm AV Area (VTI):     2.90 cm AV Vmax:           124.00 cm/s AV Vmean:          80.200 cm/s AV VTI:  0.265 m AV Peak Grad:      6.2 mmHg AV Mean Grad:      3.0 mmHg LVOT Vmax:         114.00 cm/s LVOT Vmean:        76.000 cm/s LVOT VTI:          0.245 m LVOT/AV VTI ratio: 0.92  AORTA Ao Root diam: 2.40 cm MITRAL VALVE               TRICUSPID VALVE MV Area (PHT): 4.46 cm    TR Peak grad:   9.1 mmHg MV Area VTI:   3.17 cm    TR Vmax:        151.00 cm/s MV Peak grad:  5.0 mmHg MV Mean grad:  2.0 mmHg    SHUNTS MV Vmax:       1.12 m/s    Systemic VTI:  0.24 m MV Vmean:      67.4 cm/s   Systemic Diam: 2.00 cm MV Decel Time: 170 msec MV E velocity: 84.40 cm/s MV A velocity: 67.10 cm/s MV E/A ratio:  1.26 Dwayne D Callwood MD Electronically signed by Cara JONETTA Lovelace MD Signature Date/Time: 04/06/2024/12:01:28 PM    Final    DG Chest  Portable 1 View Result Date: 04/05/2024 CLINICAL DATA:  Palpitations, shortness of breath EXAM: PORTABLE CHEST 1 VIEW COMPARISON:  None Available. FINDINGS: The heart size and mediastinal contours are within normal limits. Both lungs are clear. The visualized skeletal structures are unremarkable. IMPRESSION: No active disease. Electronically Signed   By: Lynwood Landy Raddle M.D.   On: 04/05/2024 10:20    Microbiology: No results found for this or any previous visit.  Labs: CBC: Recent Labs  Lab 04/05/24 0939 04/06/24 0137  WBC 5.3 5.9  NEUTROABS 3.1  --   HGB 13.5 10.6*  HCT 42.3 32.8*  MCV 93.2 95.1  PLT 346 240   Basic Metabolic Panel: Recent Labs  Lab 04/05/24 0939 04/05/24 1826 04/06/24 0137  NA 142  --  142  K 3.6  --  3.8  CL 104  --  112*  CO2 23  --  22  GLUCOSE 134*  --  104*  BUN 25*  --  17  CREATININE 0.68  --  0.54  CALCIUM 10.8*  --  7.6*  MG  --  2.1  --    Liver Function Tests: Recent Labs  Lab 04/05/24 0939  AST 21  ALT 13  ALKPHOS 60  BILITOT 0.5  PROT 7.5  ALBUMIN 4.1   CBG: No results for input(s): GLUCAP in the last 168 hours.  Discharge time spent: 40 minutes.  Length of inpatient stay: 1 days  Signed: Carliss LELON Canales, DO [facility] 04/06/2024         "

## 2024-04-06 NOTE — Progress Notes (Signed)
 [facility] CLINIC CARDIOLOGY PROGRESS NOTE   Patient ID: [name] MRN: [id number] DOB/AGE: 11/24/1965 59 y.o.  Admit date: 04/05/2024 Referring Physician Dr. Daved Pump Primary Physician Sparks, Reyes BIRCH, MD  Primary Cardiologist None Reason for Consultation AF RVR  HPI: [name] is a 59 y.o. female with a past medical history of paroxysmal atrial fibrillation who presented to the ED on 04/05/2024 for palpitations. Cardiology was consulted for further evaluation.   Interval History:  -Patient seen and examined this AM, resting in hospital bed with husband at bedside.  -Converted to NSR overnight. Denies CP, SOB, palpitations this AM.  -HR controlled on tele. BP soft but she is asymptomatic.  Review of systems complete and found to be negative unless listed above   Vitals:   04/06/24 0500 04/06/24 0530 04/06/24 0600 04/06/24 0630  BP: (!) 89/56 (!) 87/63 (!) 89/65 (!) 81/55  Pulse: (!) 58 70 (!) 58 (!) 57  Resp: 14     Temp:      TempSrc:      SpO2: 100% 100% 98% 98%  Weight:      Height:         Intake/Output Summary (Last 24 hours) at 04/06/2024 0911 Last data filed at 04/06/2024 0538 Gross per 24 hour  Intake 3000 ml  Output --  Net 3000 ml     PHYSICAL EXAM General: Well appearing female, well nourished, in no acute distress. HEENT: Normocephalic and atraumatic. Neck: No JVD.  Lungs: Normal respiratory effort on room air. Clear bilaterally to auscultation. No wheezes, crackles, rhonchi.  Heart: HRRR. Normal S1 and S2 without gallops or murmurs. Radial & DP pulses 2+ bilaterally. Abdomen: Non-distended appearing.  Msk: Normal strength and tone for age. Extremities: No clubbing, cyanosis or edema.   Neuro: Alert and oriented X 3. Psych: Mood appropriate, affect congruent.    LABS: Basic Metabolic Panel: Recent Labs    04/05/24 0939 04/05/24 1826 04/06/24 0137  NA 142  --  142  K 3.6  --  3.8  CL 104  --  112*  CO2 23  --  22  GLUCOSE 134*  --   104*  BUN 25*  --  17  CREATININE 0.68  --  0.54  CALCIUM 10.8*  --  7.6*  MG  --  2.1  --    Liver Function Tests: Recent Labs    04/05/24 0939  AST 21  ALT 13  ALKPHOS 60  BILITOT 0.5  PROT 7.5  ALBUMIN 4.1   No results for input(s): LIPASE, AMYLASE in the last 72 hours. CBC: Recent Labs    04/05/24 0939 04/06/24 0137  WBC 5.3 5.9  NEUTROABS 3.1  --   HGB 13.5 10.6*  HCT 42.3 32.8*  MCV 93.2 95.1  PLT 346 240   Cardiac Enzymes: No results for input(s): CKTOTAL, CKMB, CKMBINDEX, TROPONINIHS in the last 72 hours. BNP: No results for input(s): BNP in the last 72 hours. D-Dimer: Recent Labs    04/05/24 0939  DDIMER <0.27   Hemoglobin A1C: No results for input(s): HGBA1C in the last 72 hours. Fasting Lipid Panel: No results for input(s): CHOL, HDL, LDLCALC, TRIG, CHOLHDL, LDLDIRECT in the last 72 hours. Thyroid Function Tests: Recent Labs    04/05/24 1826  TSH 3.330   Anemia Panel: No results for input(s): VITAMINB12, FOLATE, FERRITIN, TIBC, IRON, RETICCTPCT in the last 72 hours.  DG Chest Portable 1 View Result Date: 04/05/2024 CLINICAL DATA:  Palpitations, shortness of breath EXAM:  PORTABLE CHEST 1 VIEW COMPARISON:  None Available. FINDINGS: The heart size and mediastinal contours are within normal limits. Both lungs are clear. The visualized skeletal structures are unremarkable. IMPRESSION: No active disease. Electronically Signed   By: Lynwood Landy Raddle M.D.   On: 04/05/2024 10:20     ECHO pending  TELEMETRY (personally reviewed): sinus rhythm rate 70s  EKG (personally reviewed): atrial fibrillation rate 172 bpm  DATA reviewed by me 04/06/24: last 24h vitals tele labs imaging I/O, hospitalist progress note  Principal Problem:   A-fib [facility])    ASSESSMENT AND PLAN: [name] is a 59 y.o. female with a past medical history of paroxysmal atrial fibrillation who presented to the ED on 04/05/2024 for  palpitations. Cardiology was consulted for further evaluation.   # Paroxysmal atrial fibrillation Patient presented with palpitations, found to be in AF RVR rate 172 on initial EKG. Given a dose of flecainide  yesterday evening and converted to NSR overnight. Maintaining in normal rhythm this AM. Troponins 7 > 14. Labs otherwise fairly unremarkable.  -Echo done this AM, pending read. If this looks ok can consider DC home today.  -Will plan for pill-in-pocket method with flecainide  100 mg as needed for episodes of atrial fibrillation up to every 12 hours.  -Can continue prn diltiazem  30 mg for episodes of atrial fibrillation.  -Will defer anticoagulation as CHADS-VASc is low. Can discuss further outpatient.  Follow up appointment scheduled for Wednesday 04/12/24 at 8:45 AM. EP appointment scheduled for March, will discuss further at follow up appointment next week.   This patient's case was discussed and created with Dr. Florencio and he is in agreement.  Signed:  Danita Bloch, PA-C  04/06/2024, 9:11 AM [facility]

## 2024-04-06 NOTE — Progress Notes (Signed)
*  PRELIMINARY RESULTS* Echocardiogram 2D Echocardiogram has been performed.  [name] 04/06/2024, 8:37 AM
